# Patient Record
Sex: Female | Born: 1950 | Race: White | Hispanic: No | Smoking: Never smoker
Health system: Southern US, Community
[De-identification: ages and names within clinical notes are randomized; demographics above are authoritative.]

## PROBLEM LIST (undated history)

## (undated) ENCOUNTER — Emergency Department (HOSPITAL_COMMUNITY): Payer: Medicare HMO

## (undated) DIAGNOSIS — R519 Headache, unspecified: Secondary | ICD-10-CM

## (undated) DIAGNOSIS — G4730 Sleep apnea, unspecified: Secondary | ICD-10-CM

## (undated) DIAGNOSIS — K219 Gastro-esophageal reflux disease without esophagitis: Secondary | ICD-10-CM

## (undated) DIAGNOSIS — E039 Hypothyroidism, unspecified: Secondary | ICD-10-CM

## (undated) DIAGNOSIS — M1990 Unspecified osteoarthritis, unspecified site: Secondary | ICD-10-CM

## (undated) DIAGNOSIS — I10 Essential (primary) hypertension: Secondary | ICD-10-CM

## (undated) DIAGNOSIS — F410 Panic disorder [episodic paroxysmal anxiety] without agoraphobia: Secondary | ICD-10-CM

## (undated) HISTORY — PX: BACK SURGERY: SHX140

## (undated) HISTORY — PX: HIP SURGERY: SHX245

## (undated) HISTORY — PX: NECK SURGERY: SHX720

---

## 1993-01-11 DIAGNOSIS — C801 Malignant (primary) neoplasm, unspecified: Secondary | ICD-10-CM

## 1993-01-11 HISTORY — DX: Malignant (primary) neoplasm, unspecified: C80.1

## 2000-02-25 ENCOUNTER — Encounter: Admission: RE | Admit: 2000-02-25 | Discharge: 2000-02-25 | Payer: Self-pay | Admitting: Family Medicine

## 2000-02-25 ENCOUNTER — Encounter: Payer: Self-pay | Admitting: Family Medicine

## 2000-03-07 ENCOUNTER — Encounter: Payer: Self-pay | Admitting: Family Medicine

## 2000-03-07 ENCOUNTER — Encounter: Admission: RE | Admit: 2000-03-07 | Discharge: 2000-03-07 | Payer: Self-pay | Admitting: Family Medicine

## 2000-09-23 ENCOUNTER — Encounter: Admission: RE | Admit: 2000-09-23 | Discharge: 2000-09-23 | Payer: Self-pay | Admitting: Neurological Surgery

## 2000-09-23 ENCOUNTER — Encounter: Payer: Self-pay | Admitting: Neurological Surgery

## 2004-08-17 ENCOUNTER — Inpatient Hospital Stay (HOSPITAL_COMMUNITY): Admission: RE | Admit: 2004-08-17 | Discharge: 2004-08-19 | Payer: Self-pay | Admitting: Neurological Surgery

## 2004-11-04 ENCOUNTER — Ambulatory Visit (HOSPITAL_COMMUNITY): Admission: RE | Admit: 2004-11-04 | Discharge: 2004-11-04 | Payer: Self-pay | Admitting: Neurological Surgery

## 2006-03-29 ENCOUNTER — Ambulatory Visit (HOSPITAL_COMMUNITY): Admission: RE | Admit: 2006-03-29 | Discharge: 2006-03-30 | Payer: Self-pay | Admitting: Neurological Surgery

## 2008-04-11 ENCOUNTER — Encounter: Admission: RE | Admit: 2008-04-11 | Discharge: 2008-04-11 | Payer: Self-pay | Admitting: Neurological Surgery

## 2010-01-31 ENCOUNTER — Encounter: Payer: Self-pay | Admitting: Neurological Surgery
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
## 2010-05-29 NOTE — Op Note (Signed)
NAME:  Candice Hunt, Candice Hunt                 ACCOUNT NO.:  [id number]   MEDICAL RECORD NO.:  [id number]          PATIENT TYPE:  AMB   LOCATION:  SDS                          FACILITY:  [facility]   PHYSICIAN:  Stefani Dama, M.D.  DATE OF BIRTH:  10-Apr-1950   DATE OF PROCEDURE:  03/29/2006  DATE OF DISCHARGE:                               OPERATIVE REPORT   PREOPERATIVE DIAGNOSIS:  Herniated nucleus pulposus L2-L3, L1-L2, with  left L2 radiculopathy, spondylosis L4-L5 with left lumbar radiculopathy.   POSTOPERATIVE DIAGNOSIS:  Herniated nucleus pulposus L2-L3, L1-L2, with  left L2 radiculopathy, spondylosis L4-L5 with left lumbar radiculopathy.   PROCEDURE:  Hemilaminectomy of L2, microdiscectomy to decompress the L2  nerve root on the left secondary to L1-L2 disc herniation, laminectomy  L4-L5 with foraminotomy on the left, microdiscectomy at L4-L5 left,  operating microscope, microdissection technique.   SURGEON:  Stefani Dama, M.D.   FIRST ASSISTANT:  Clydene Fake, M.D.   ANESTHESIA:  General endotracheal.   INDICATIONS:  Ms. Candice Hunt is a 60 year old individual who has had  significant back and left lower extremity pain radiating from the  anterior thigh to the posterior thigh into the calf, the leg, and the  foot. She was found to have an extruded fragment of disc behind the body  of L2.  It is uncertain whether this disc herniation occurred from L2-L3  and herniated up or occurred from L1-L2 and herniated downwards.  Nonetheless, there is a fragment compressing the L2 nerve root on the  left side. Also on the left side there was noted be severe spondylitic  changes at L4-L5 with lateral recess stenosis and there was suspected to  be a chronic disc herniation at L4-L5.  She is now undergoing surgical  decompression of both these areas.   DESCRIPTION OF PROCEDURE:  The patient was brought to the operating room  supine on the stretcher. After the smooth induction of general  endotracheal anesthesia, she was turned prone.  Her back was prepped  with alcohol and DuraPrep and draped in a sterile fashion.  A midline  incision was creating and carried down to the lumbodorsal fascia which  was opened to the left side of midline.  The first interspace identified  was that of L3.  At the L3-L4 interspace, dissection was carried  inferiorly to expose L4-L5.  Dissection was also carried superiorly in a  subperiosteal fashion and dissection was performed at L2-L3 to expose  the entire laminar arch of L2.  A self-retaining retractor was placed in  the wound at the L2-L3 level.   The laminotomy was initially created removing the inferior margin of the  lamina of L2 and this exposed the region of the disc space at L2-L3. The  area was palpated and no disc material was noted to be extruded from  this area and the ligament was intact.  Dissection was then carried  cephalad completing the hemilaminectomy of the L2 vertebrae on the left  and removing the redundant yellow ligament in this area and also  removing some of the inferior margin  of the facet of L1. This exposed  the L1-L2 disc space. By palpating lateral and inferior from the disc  space, a fragment of disc was encountered.  This was on the medial  aspect of the L2 nerve root.  This was resected and allowed for  decompression of the L2 nerve root.  Further dissection yielded several  other fragments of disc that were compressing the L2 nerve root from  underneath it and then the inferior aspect the L2 nerve root was  explored and several other fragments of disc were encountered here. The  area out the foramen of L2 was palpated.  No disc fragments were  obtained from this dissection.  This dissection was performed with the  use of the operating microscope using a combination of bipolar cautery  and microdissection technique to resect and remove the disc and  decompress the L2 nerve root.  In the end, the L2 nerve  root was well  decompressed.   Attention was then turned L4-L5.  Here, a laminotomy was created  removing the inferior margin of the  lamina of L4. The dissection was  taken out to the facet joint and a partial facetectomy was completed.  The superior margin of the lamina of L5 was also resected and this  exposed the common dural tube.  Here, the L5 nerve root was noted to be  bowed significantly dorsally in the takeoff zone and with some  dissection, there was noted to be a significant amount of fascial  attachments in a scar like fashion and the nerve root could ultimately  be mobilized.  The underlying mass was, in fact, noted to be a fragment  of disc which was fairly scarred into place. By dissecting under the  fragment, the disc could gradually be mobilized and with removal of this  fragment, the foramen was noted to be much more widely patent. Further  dissection along the path of the nerve root yielded no other fragments.  There was some fascial attachments from adhesions. The nerve root was  mobilized and freed out the foramen and more proximally, the nerve root  was palpated and relieved from its attachment to the posterior  longitudinal ligament. In this area, the posterior longitudinal ligament  was cauterized.  No openings into the ligament were encountered.  It was  felt that this disc herniation likely had occurred some time ago and  there had been a considerable amount of healing over it.  With the nerve  root being decompressed in this region, hemostasis was achieved  meticulously in the epidural space.  It should be noted that both these  sites, L2 lateral pedicle and the L4-L5 interspace were identified  positively with radiography to assure that the locations corresponded  with the findings on the MRI films.   These nerve roots being decompressed, hemostasis was achieved. 1 mL of fentanyl, 50 mcg, with 40 mg of Depo-Medrol was left in the epidural  space.  The  microscope was then withdrawn.  The lumbodorsal fascia was  closed with #1 Vicryl in an interrupted fashion, 2-0 Vicryl was used in  the subcutaneous tissues, 3-0 Vicryl was used to close the subcuticular  skin.  The patient tolerated the procedure well.  Blood loss was  estimated at 100 mL.  She was returned to the recovery room in stable  condition.      Stefani Dama, M.D.  Electronically Signed     HJE/MEDQ  D:  03/29/2006  T:  03/30/2006  Job:  [id number]
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
## 2012-03-23 ENCOUNTER — Encounter (HOSPITAL_BASED_OUTPATIENT_CLINIC_OR_DEPARTMENT_OTHER): Payer: Self-pay | Admitting: *Deleted

## 2012-03-23 ENCOUNTER — Emergency Department (HOSPITAL_BASED_OUTPATIENT_CLINIC_OR_DEPARTMENT_OTHER)
Admission: EM | Admit: 2012-03-23 | Discharge: 2012-03-23 | Disposition: A | Discharge status: Home / Self Care | Payer: Medicare Other | Attending: Emergency Medicine | Admitting: Emergency Medicine

## 2012-03-23 ENCOUNTER — Emergency Department (HOSPITAL_BASED_OUTPATIENT_CLINIC_OR_DEPARTMENT_OTHER): Payer: Medicare Other

## 2012-03-23 DIAGNOSIS — Z79899 Other long term (current) drug therapy: Secondary | ICD-10-CM | POA: Insufficient documentation

## 2012-03-23 DIAGNOSIS — S6990XA Unspecified injury of unspecified wrist, hand and finger(s), initial encounter: Secondary | ICD-10-CM | POA: Insufficient documentation

## 2012-03-23 DIAGNOSIS — Y9289 Other specified places as the place of occurrence of the external cause: Secondary | ICD-10-CM | POA: Insufficient documentation

## 2012-03-23 DIAGNOSIS — Z7982 Long term (current) use of aspirin: Secondary | ICD-10-CM | POA: Insufficient documentation

## 2012-03-23 DIAGNOSIS — F410 Panic disorder [episodic paroxysmal anxiety] without agoraphobia: Secondary | ICD-10-CM | POA: Insufficient documentation

## 2012-03-23 DIAGNOSIS — Z23 Encounter for immunization: Secondary | ICD-10-CM | POA: Insufficient documentation

## 2012-03-23 DIAGNOSIS — Y939 Activity, unspecified: Secondary | ICD-10-CM | POA: Insufficient documentation

## 2012-03-23 DIAGNOSIS — I10 Essential (primary) hypertension: Secondary | ICD-10-CM | POA: Insufficient documentation

## 2012-03-23 DIAGNOSIS — IMO0001 Reserved for inherently not codable concepts without codable children: Secondary | ICD-10-CM | POA: Insufficient documentation

## 2012-03-23 HISTORY — DX: Panic disorder (episodic paroxysmal anxiety): F41.0

## 2012-03-23 HISTORY — DX: Essential (primary) hypertension: I10

## 2012-03-23 MED ORDER — TETANUS-DIPHTH-ACELL PERTUSSIS 5-2.5-18.5 LF-MCG/0.5 IM SUSP
0.5000 mL | Freq: Once | INTRAMUSCULAR | Status: AC
  Administered 2012-03-23: 0.5 mL via INTRAMUSCULAR
  Filled 2012-03-23: qty 0.5

## 2012-03-23 MED ORDER — SODIUM CHLORIDE 0.9 % IV SOLN
3.0000 g | Freq: Once | INTRAVENOUS | Status: AC
  Administered 2012-03-23: 3 g via INTRAVENOUS
  Filled 2012-03-23: qty 3

## 2012-03-23 MED ORDER — AMOXICILLIN-POT CLAVULANATE 875-125 MG PO TABS: 1.0000 | ORAL_TABLET | Freq: Two times a day (BID) | ORAL | Status: AC

## 2012-03-23 MED ORDER — SODIUM CHLORIDE 0.9 % IV SOLN: Freq: Once | INTRAVENOUS | Status: DC

## 2012-03-23 NOTE — ED Notes (Signed)
Cat bite to right hand with swelling and redness noted. Incident occurred on Sunday.

## 2012-03-23 NOTE — ED Provider Notes (Signed)
History     CSN: [id number]  Arrival date & time 03/23/12  0320   First MD Initiated Contact with Patient 03/23/12 [phone]      Chief Complaint  Patient presents with  . Animal Bite    (Consider location/radiation/quality/duration/timing/severity/associated sxs/prior treatment) HPI This 62 year old female who was bit in the right hand by a stray cat 4 days ago. The cat's immunization status is unknown. The location of the bite is over the proximal dorsal aspect of the first metacarpal. It has subsequently become erythematous, swollen and more painful. The symptoms are mild to moderate. They're worse with movement or palpation. She has had some chills with this. She states she is very anxious.  Past Medical History  Diagnosis Date  . Hypertension   . Panic attacks     Past Surgical History  Procedure Laterality Date  . Back surgery    . Neck surgery    . Hip surgery      History reviewed. No pertinent family history.  History  Substance Use Topics  . Smoking status: Never Smoker   . Smokeless tobacco: Not on file  . Alcohol Use: No    OB History   Grav Para Term Preterm Abortions TAB SAB Ect Mult Living                  Review of Systems  All other systems reviewed and are negative.    Allergies  Avelox and Prednisone  Home Medications   Current Outpatient Rx  Name  Route  Sig  Dispense  Refill  . ALPRAZolam (XANAX PO)   Oral   Take 10 mg by mouth as needed.         Marland Kitchen amLODipine-valsartan (EXFORGE) 5-160 MG per tablet   Oral   Take 1 tablet by mouth daily.         Marland Kitchen aspirin 81 MG tablet   Oral   Take 81 mg by mouth daily.         Marland Kitchen escitalopram (LEXAPRO) 10 MG tablet   Oral   Take 30 mg by mouth daily.         Marland Kitchen oxyCODONE-acetaminophen (PERCOCET) 7.5-325 MG per tablet   Oral   Take 1 tablet by mouth every 4 (four) hours as needed for pain.           BP 159/79  Pulse 89  Temp(Src) 97.8 F (36.6 C) (Oral)  Resp 18  Ht 5\' 1"   (1.549 m)  Wt 142 lb (64.411 kg)  BMI 26.84 kg/m2  SpO2 98%  Physical Exam General: Well-developed, well-nourished female in no acute distress; appearance consistent with age of record HENT: normocephalic, atraumatic Eyes: pupils equal round and reactive to light; extraocular muscles intact Neck: supple Heart: regular rate and rhythm Lungs: clear to auscultation bilaterally Abdomen: soft; nondistended; bowel sounds present Extremities: No deformity; full range of motion; pulses normal Neurologic: Awake, alert and oriented; motor function intact in all extremities and symmetric; no facial droop Skin: Warm and dry; bite wound overlying dorsal right first metacarpal with mild surrounding erythema, tenderness and swelling, no lymphangitis seen; right-hand neurovascularly intact Psychiatric: Anxious    ED Course  Procedures (including critical care time)    MDM  3:49 AM The patient is adamant about not be getting the rabies series at this time. She was advised that rabies is universally fatal. She will make an attempt to locate the cat tomorrow or have animal control will get the cat. If unable that  she will return to start the series.  She is amenable to a tetanus booster as well as antibiotics. We will give 3 g of Unasyn IV and place her on Augmentin as an outpatient.  Dg Hand Complete Right  03/23/2012  *RADIOLOGY REPORT*  Clinical Data: Cat bite to left thumb, with swelling.  RIGHT HAND - COMPLETE 3+ VIEW  Comparison: None.  Findings: Degenerative changes of the interphalangeal joints and first carpal-metacarpal joint. There is mild ulnar subluxation of the first carpal-metacarpal joint.  Degenerative changes of the third carpal-metacarpal joint, with hook osteophyte off the head of the third metacarpal.  Mild radiocarpal degenerative change. No displaced fracture.  No dislocation.  No radiopaque foreign body.  IMPRESSION: No acute osseous finding.  Mild ulnar subluxation at the first  metacarpal phalangeal joint is likely degenerative in nature.  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   Original Report Authenticated By: Jearld Lesch, M.D.        Hanley Seamen, MD 03/23/12 [phone]

## 2012-03-23 NOTE — ED Notes (Signed)
Pt states that she currently trying to get signed up again with pain management for her chronic pain issues.

## 2012-03-23 NOTE — ED Notes (Signed)
Pt states that she is not interested in receiving rabies vaccine just concerned with having an infection in the site.

## 2012-03-23 NOTE — ED Notes (Signed)
Patient transported to X-ray 

## 2012-03-23 NOTE — ED Notes (Signed)
MD at bedside. 

## 2012-03-23 NOTE — ED Notes (Signed)
Reminded pt of importance of contacting animal control and obtaining cat in order to test for rabies. Pt continues to refuse rabies injections at this time and was reminded to follow up if her sxs worsened or if she was unable to locate the animal. Pt agrees with plan of care and verbalizes need for further care related to this issue.

## 2019-03-22 ENCOUNTER — Emergency Department (HOSPITAL_COMMUNITY): Payer: Medicare HMO

## 2019-03-22 ENCOUNTER — Emergency Department (HOSPITAL_COMMUNITY)
Admission: EM | Admit: 2019-03-22 | Discharge: 2019-03-22 | Disposition: A | Discharge status: Home / Self Care | Payer: Medicare HMO | Attending: Emergency Medicine | Admitting: Emergency Medicine

## 2019-03-22 ENCOUNTER — Other Ambulatory Visit: Payer: Self-pay

## 2019-03-22 DIAGNOSIS — M542 Cervicalgia: Secondary | ICD-10-CM | POA: Insufficient documentation

## 2019-03-22 DIAGNOSIS — Z79899 Other long term (current) drug therapy: Secondary | ICD-10-CM | POA: Diagnosis not present

## 2019-03-22 DIAGNOSIS — R202 Paresthesia of skin: Secondary | ICD-10-CM | POA: Diagnosis not present

## 2019-03-22 DIAGNOSIS — R42 Dizziness and giddiness: Secondary | ICD-10-CM | POA: Diagnosis not present

## 2019-03-22 DIAGNOSIS — I672 Cerebral atherosclerosis: Secondary | ICD-10-CM | POA: Insufficient documentation

## 2019-03-22 DIAGNOSIS — G8929 Other chronic pain: Secondary | ICD-10-CM | POA: Insufficient documentation

## 2019-03-22 LAB — CBC WITH DIFFERENTIAL/PLATELET
Abs Immature Granulocytes: 0.02 10*3/uL (ref 0.00–0.07)
Basophils Absolute: 0.1 10*3/uL (ref 0.0–0.1)
Basophils Relative: 1 %
Eosinophils Absolute: 0.2 10*3/uL (ref 0.0–0.5)
Eosinophils Relative: 3 %
HCT: 43.5 % (ref 36.0–46.0)
Hemoglobin: 14.5 g/dL (ref 12.0–15.0)
Immature Granulocytes: 0 %
Lymphocytes Relative: 28 %
Lymphs Abs: 2.1 10*3/uL (ref 0.7–4.0)
MCH: 29.1 pg (ref 26.0–34.0)
MCHC: 33.3 g/dL (ref 30.0–36.0)
MCV: 87.2 fL (ref 80.0–100.0)
Monocytes Absolute: 0.7 10*3/uL (ref 0.1–1.0)
Monocytes Relative: 10 %
Neutro Abs: 4.3 10*3/uL (ref 1.7–7.7)
Neutrophils Relative %: 58 %
Platelets: 222 10*3/uL (ref 150–400)
RBC: 4.99 MIL/uL (ref 3.87–5.11)
RDW: 11.4 % — ABNORMAL LOW (ref 11.5–15.5)
WBC: 7.3 10*3/uL (ref 4.0–10.5)
nRBC: 0 % (ref 0.0–0.2)

## 2019-03-22 LAB — I-STAT CREATININE, ED: Creatinine, Ser: 0.4 mg/dL — ABNORMAL LOW (ref 0.44–1.00)

## 2019-03-22 LAB — BASIC METABOLIC PANEL
Anion gap: 12 (ref 5–15)
BUN: 16 mg/dL (ref 8–23)
CO2: 24 mmol/L (ref 22–32)
Calcium: 9.6 mg/dL (ref 8.9–10.3)
Chloride: 103 mmol/L (ref 98–111)
Creatinine, Ser: 0.49 mg/dL (ref 0.44–1.00)
GFR calc Af Amer: >60 mL/min (ref >60)
GFR calc non Af Amer: >60 mL/min (ref >60)
Glucose, Bld: 97 mg/dL (ref 70–99)
Potassium: 3.9 mmol/L (ref 3.5–5.1)
Sodium: 139 mmol/L (ref 135–145)

## 2019-03-22 MED ORDER — GADOBUTROL 1 MMOL/ML IV SOLN
6.0000 mL | Freq: Once | INTRAVENOUS | Status: AC | PRN
  Administered 2019-03-22: 20:00:00 6 mL via INTRAVENOUS

## 2019-03-22 NOTE — ED Provider Notes (Signed)
[facility] EMERGENCY DEPARTMENT Provider Note   CSN: [id number] Arrival date & time: 03/22/19  1426     History Chief Complaint  Patient presents with  . Neck Pain    Candice Hunt is a 69 y.o. female.  69 yo F with a chief complaints of neck pain.  Patient has a history of chronic neck pain but it is worsened over the past couple weeks after she had a articulation of her spine by a massage therapist.  Has been feeling dizziness off and on as well as has worsening of her chronic left-sided tingling.  She feels that her neck is been popping constantly with very minimal movement.  She tried to set up an appointment with her neurosurgeon but he was out of town.  She had a virtual appointment with her family doctor today who suggested she go immediately to the ED for evaluation.  Has had some chronic numbness and weakness to her left upper and left lower extremity but thinks it is worse.  Has difficulty describing the dizziness.  States it comes and goes and has trouble deciding when it occurs.  She denies overt trauma to her neck.  The history is provided by the patient.  Neck Pain Pain location:  Generalized neck Quality:  Shooting Pain radiates to:  Does not radiate Pain severity:  Moderate Onset quality:  Gradual Duration:  2 weeks Timing:  Constant Progression:  Worsening Chronicity:  New Relieved by:  Nothing Worsened by:  Nothing Ineffective treatments:  None tried Associated symptoms: no chest pain, no fever and no headaches        Past Medical History:  Diagnosis Date  . Hypertension   . Panic attacks     There are no problems to display for this patient.   Past Surgical History:  Procedure Laterality Date  . BACK SURGERY    . HIP SURGERY    . NECK SURGERY       OB History   No obstetric history on file.     No family history on file.  Social History   Tobacco Use  . Smoking status: Never Smoker  Substance Use Topics  . Alcohol  use: No  . Drug use: No    Home Medications Prior to Admission medications   Medication Sig Start Date End Date Taking? Authorizing Provider  ALPRAZolam Duanne Moron) 1 MG tablet Take 1 mg by mouth 3 (three) times daily as needed for anxiety.   Yes [provider]  amLODipine-valsartan (EXFORGE) 10-160 MG tablet Take 0.5 tablets by mouth daily. 11/13/18  Yes [provider]  Cholecalciferol (VITAMIN D3) 50 MCG (2000 UT) TABS Take 2,000 Units by mouth 3 (three) times a week.   Yes [provider]  citalopram (CELEXA) 20 MG tablet Take 30 mg by mouth daily. 01/01/19  Yes [provider]  Cyanocobalamin (VITAMIN B-12 PO) Take 1 tablet by mouth daily.   Yes [provider]  HYDROcodone-acetaminophen (NORCO/VICODIN) 5-325 MG tablet Take 0.5-1 tablets by mouth every 6 (six) hours as needed for moderate pain or severe pain.    Yes [provider]  hydrOXYzine (ATARAX/VISTARIL) 25 MG tablet Take 25 mg by mouth at bedtime. 01/27/19  Yes [provider]  levothyroxine (SYNTHROID) 25 MCG tablet Take 25 mcg by mouth daily before breakfast. 12/18/18  Yes [provider]  meloxicam (MOBIC) 15 MG tablet Take 15 mg by mouth daily. 03/09/19  Yes [provider]  omeprazole (PRILOSEC) 40 MG  capsule Take 40 mg by mouth in the morning and at bedtime.   Yes [provider]  Polyethyl Glycol-Propyl Glycol 0.4-0.3 % SOLN Place 1 drop into both eyes as needed (for dryness).    Yes [provider]  traMADol (ULTRAM) 50 MG tablet Take 50 mg by mouth 2 (two) times daily as needed (for pain).   Yes [provider]  triamcinolone cream (KENALOG) 0.1 % Apply 1 application topically See admin instructions. Apply to affected area(s) 2-3 times a day as needed for itching 03/09/19  Yes [provider]  amoxicillin-clavulanate (AUGMENTIN) 875-125 MG per tablet Take 1 tablet by mouth 2 (two) times daily. One po bid x 7  days Patient not taking: Reported on 03/22/2019 03/23/12   Molpus, John, MD    Allergies    Moxifloxacin, Avelox [moxifloxacin hcl in nacl], and Prednisone  Review of Systems   Review of Systems  Constitutional: Negative for chills and fever.  HENT: Negative for congestion and rhinorrhea.   Eyes: Negative for redness and visual disturbance.  Respiratory: Negative for shortness of breath and wheezing.   Cardiovascular: Negative for chest pain and palpitations.  Gastrointestinal: Negative for nausea and vomiting.  Genitourinary: Negative for dysuria and urgency.  Musculoskeletal: Positive for neck pain. Negative for arthralgias and myalgias.  Skin: Negative for pallor and wound.  Neurological: Negative for dizziness and headaches.    Physical Exam Updated Vital Signs BP (!) 148/69   Pulse 74   Temp 98.5 F (36.9 C) (Oral)   Resp 18   SpO2 96%   Physical Exam Vitals and nursing note reviewed.  Constitutional:      General: She is not in acute distress.    Appearance: She is well-developed. She is not diaphoretic.  HENT:     Head: Normocephalic and atraumatic.     Comments: Prominent thoracic kyphosis.  No obvious midline spinal tenderness.  Likely chronic deformity. Eyes:     Pupils: Pupils are equal, round, and reactive to light.  Cardiovascular:     Rate and Rhythm: Normal rate and regular rhythm.     Heart sounds: No murmur. No friction rub. No gallop.   Pulmonary:     Effort: Pulmonary effort is normal.     Breath sounds: No wheezing or rales.  Abdominal:     General: There is no distension.     Palpations: Abdomen is soft.     Tenderness: There is no abdominal tenderness.  Musculoskeletal:        General: No tenderness.     Cervical back: Normal range of motion and neck supple.     Comments: Pulse motor and sensation intact to bilateral upper and lower extremities.  Mild weakness to left lower extremity secondary to back pain.  Skin:    General: Skin is warm and  dry.  Neurological:     Mental Status: She is alert and oriented to person, place, and time.  Psychiatric:        Behavior: Behavior normal.     ED Results / Procedures / Treatments   Labs (all labs ordered are listed, but only abnormal results are displayed) Labs Reviewed  CBC WITH DIFFERENTIAL/PLATELET - Abnormal; Notable for the following components:      Result Value   RDW 11.4 (*)    All other components within normal limits  I-STAT CREATININE, ED - Abnormal; Notable for the following components:   Creatinine, Ser 0.40 (*)    All other components within normal limits  BASIC METABOLIC PANEL    EKG None  Radiology MR ANGIO HEAD WO CONTRAST  Result Date: 03/22/2019 CLINICAL DATA:  69 year old female with acute neck pain. Prior cervical spine surgery. Possible vertebral artery dissection. EXAM: MRA HEAD WITHOUT CONTRAST MRA NECK WITHOUT AND WITH CONTRAST TECHNIQUE: Angiographic images of the Circle of Willis were obtained using MRA technique without intravenous contrast. Angiographic images of the neck were obtained using MRA technique without and with intravenous contrast. Carotid stenosis measurements (when applicable) are obtained utilizing NASCET criteria, using the distal internal carotid diameter as the denominator. CONTRAST:  85mL GADAVIST GADOBUTROL 1 MMOL/ML IV SOLN COMPARISON:  Brain MRI and Cervical spine MRI today reported separately. FINDINGS: MRA NECK FINDINGS Precontrast time-of-flight images demonstrate antegrade flow in both cervical carotid and vertebral arteries to the skull base. The vertebral arteries appear codominant. The carotid bifurcations are within normal limits. Post-contrast neck MRA images reveal a 3 vessel arch configuration. Proximal great vessels appear normal. Mildly tortuous proximal right CCA. At the right carotid bifurcation mild irregularity primarily affects the proximal ECA. No cervical right ICA stenosis. Mild irregularity of the visible right ICA  siphon. Negative proximal left CCA. Negative left carotid bifurcation and cervical left ICA. Negative visible left ICA siphon. Proximal subclavian arteries and vertebral artery origins appear normal. The bilateral cervical vertebral arteries appear symmetric without irregularity or stenosis. Negative visible posterior circulation. MRA HEAD FINDINGS Antegrade flow in the posterior circulation. Codominant distal vertebral arteries without stenosis. Normal right PICA origin. The left PICA appears to be diminutive or absent. Patent vertebrobasilar junction and basilar artery without stenosis. Patent SCA and PCA origins. Posterior communicating arteries are diminutive or absent. Right PCA branches are within normal limits. There is a moderate stenosis of the left PCA P1/P2 junction. Other left PCA branches are within normal limits. Antegrade flow in both ICA siphons. No siphon stenosis. Normal ophthalmic artery origins. Patent carotid termini. Patent MCA and ACA origins. Anterior communicating artery and visible ACA branches are within normal limits. Left MCA M1 segment, left MCA bifurcation and visible left MCA branches are within normal limits. Right MCA M1 segment, trifurcation, and visible right MCA branches are within normal limits. IMPRESSION: 1. Negative neck MRA. No evidence of vertebral artery dissection across this series of head and neck MR today. 2. Evidence of some intracranial atherosclerosis, with a moderate stenosis of the left PCA. 3. Otherwise negative intracranial MRA. Electronically Signed   By: Genevie Ann M.D.   On: 03/22/2019 19:50   MR Angiogram Neck W or Wo Contrast  Result Date: 03/22/2019 CLINICAL DATA:  69 year old female with acute neck pain. Prior cervical spine surgery. Possible vertebral artery dissection. EXAM: MRA HEAD WITHOUT CONTRAST MRA NECK WITHOUT AND WITH CONTRAST TECHNIQUE: Angiographic images of the Circle of Willis were obtained using MRA technique without intravenous contrast.  Angiographic images of the neck were obtained using MRA technique without and with intravenous contrast. Carotid stenosis measurements (when applicable) are obtained utilizing NASCET criteria, using the distal internal carotid diameter as the denominator. CONTRAST:  55mL GADAVIST GADOBUTROL 1 MMOL/ML IV SOLN COMPARISON:  Brain MRI and Cervical spine MRI today reported separately. FINDINGS: MRA NECK FINDINGS Precontrast time-of-flight images demonstrate antegrade flow in both cervical carotid and vertebral arteries to the skull base. The vertebral arteries appear codominant. The carotid bifurcations are within normal limits. Post-contrast neck MRA images reveal a 3 vessel arch configuration. Proximal great vessels appear normal. Mildly tortuous proximal right CCA. At the right carotid bifurcation mild irregularity primarily affects  the proximal ECA. No cervical right ICA stenosis. Mild irregularity of the visible right ICA siphon. Negative proximal left CCA. Negative left carotid bifurcation and cervical left ICA. Negative visible left ICA siphon. Proximal subclavian arteries and vertebral artery origins appear normal. The bilateral cervical vertebral arteries appear symmetric without irregularity or stenosis. Negative visible posterior circulation. MRA HEAD FINDINGS Antegrade flow in the posterior circulation. Codominant distal vertebral arteries without stenosis. Normal right PICA origin. The left PICA appears to be diminutive or absent. Patent vertebrobasilar junction and basilar artery without stenosis. Patent SCA and PCA origins. Posterior communicating arteries are diminutive or absent. Right PCA branches are within normal limits. There is a moderate stenosis of the left PCA P1/P2 junction. Other left PCA branches are within normal limits. Antegrade flow in both ICA siphons. No siphon stenosis. Normal ophthalmic artery origins. Patent carotid termini. Patent MCA and ACA origins. Anterior communicating artery and  visible ACA branches are within normal limits. Left MCA M1 segment, left MCA bifurcation and visible left MCA branches are within normal limits. Right MCA M1 segment, trifurcation, and visible right MCA branches are within normal limits. IMPRESSION: 1. Negative neck MRA. No evidence of vertebral artery dissection across this series of head and neck MR today. 2. Evidence of some intracranial atherosclerosis, with a moderate stenosis of the left PCA. 3. Otherwise negative intracranial MRA. Electronically Signed   By: Genevie Ann M.D.   On: 03/22/2019 19:50   MR BRAIN WO CONTRAST  Result Date: 03/22/2019 CLINICAL DATA:  69 year old female with acute neck pain. Prior cervical spine surgery. Possible vertebral artery dissection. EXAM: MRI HEAD WITHOUT CONTRAST TECHNIQUE: Multiplanar, multiecho pulse sequences of the brain and surrounding structures were obtained without intravenous contrast. COMPARISON:  Report of [facility] brain MRI 05/01/2013 (no images available). FINDINGS: Brain: No restricted diffusion to suggest acute infarction. No midline shift, mass effect, evidence of mass lesion, ventriculomegaly, extra-axial collection or acute intracranial hemorrhage. Cervicomedullary junction and pituitary are within normal limits. Mild for age scattered small and subtle nonspecific white matter T2 and FLAIR hyperintense foci in both hemispheres. No cortical encephalomalacia. No definite chronic cerebral blood products. The deep gray nuclei, brainstem and cerebellum appear normal. Vascular: Major intracranial vascular flow voids are preserved. The distal vertebral arteries appear codominant. Skull and upper cervical spine: Cervical spine details reported separately. Normal bone marrow signal. Sinuses/Orbits: Negative orbits. Paranasal sinuses are clear. Other: Mastoids are clear. Grossly normal visible internal auditory structures. Scalp and face soft tissues appear negative.  IMPRESSION: 1. No acute intracranial abnormality. Mild for age nonspecific cerebral white matter signal changes. 2. MRA and cervical spine MRI today reported separately. Electronically Signed   By: Genevie Ann M.D.   On: 03/22/2019 19:59   MR Cervical Spine W or Wo Contrast  Result Date: 03/22/2019 CLINICAL DATA:  70 year old female with acute neck pain. Prior cervical spine surgery. Possible vertebral artery dissection. EXAM: MRI CERVICAL SPINE WITHOUT AND WITH CONTRAST TECHNIQUE: Multiplanar and multiecho pulse sequences of the cervical spine, to include the craniocervical junction and cervicothoracic junction, were obtained without and with intravenous contrast. CONTRAST:  14mL GADAVIST GADOBUTROL 1 MMOL/ML IV SOLN COMPARISON:  Brain MRI, head and neck MRA today reported separately. FINDINGS: Alignment: Straightening of cervical lordosis with subtle anterolisthesis of C3 on C4. Vertebrae: Previous long segment C4 through T1 ACDF with associated hardware susceptibility artifact. There is degenerative appearing marrow edema and enhancement in the right posterosuperior T2 endplate and pedicle (series 9,  image 5 and series 13, image 7. There is also mild marrow edema in the C3 posterior elements on series 9, image 10. No other marrow edema or acute osseous abnormality. Normal background bone marrow signal. Cord: Spinal cord signal is within normal limits at all visualized levels. No abnormal intradural enhancement. No dural thickening. Posterior Fossa, vertebral arteries, paraspinal tissues: The vertebral artery flow voids in the neck are preserved, appear symmetric and normal. The other major vascular flow voids in the neck are preserved. Negative visible neck soft tissues and lung apices. Disc levels: C2-C3: Mild facet and ligament flavum hypertrophy. Effaced dorsal CSF space but no significant stenosis. C3-C4: Subtle anterolisthesis with circumferential disc bulge and endplate spurring and moderate to severe facet  and ligament flavum hypertrophy. Trace degenerative facet joint fluid. Mild posterior element marrow edema as stated above. Mild spinal stenosis and spinal cord mass effect (series 7, image 9). Severe bilateral C4 foraminal stenosis. C4-C5: Prior ACDF with evidence of solid arthrodesis and no stenosis. Left facet hypertrophy. C5-C6: Prior ACDF with evidence of solid arthrodesis and no stenosis. C6-C7: Prior ACDF with evidence of solid arthrodesis and no stenosis. C7-T1: Prior ACDF with evidence of solid arthrodesis and no stenosis. T1-T2: Disc space loss with circumferential disc bulge and endplate spurring. Moderate posterior element hypertrophy. Effaced ventral CSF space but no significant spinal stenosis. Moderate to severe left and severe right T1 foraminal stenosis. T2-T3: Disc space loss and posterior element hypertrophy with mild left and mild-to-moderate right T2 foraminal stenosis. IMPRESSION: 1. Previous long segment spinal fusion C4 through T1. Evidence of solid arthrodesis and no adverse features. 2. 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 Mild degenerative posterior element marrow edema. Mild spinal stenosis with spinal cord mass effect but no cord signal abnormality. Severe bilateral C4 foraminal stenosis. -T1-T2 advanced disc and moderate posterior element degeneration. Mild degenerative appearing right T2 pedicle marrow edema. Moderate to severe bilateral T1 foraminal stenosis without significant spinal stenosis. 3. Symmetric and normal appearing vertebral artery flow voids in the neck. Electronically Signed   By: Genevie Ann M.D.   On: 03/22/2019 19:58    Procedures Procedures (including critical care time)  Medications Ordered in ED Medications  gadobutrol (GADAVIST) 1 MMOL/ML injection 6 mL (6 mLs Intravenous Contrast Given 03/22/19 1932)    ED Course  I have reviewed the triage vital  signs and the nursing notes.  Pertinent labs & imaging results that were available during my care of the patient were reviewed by me and considered in my medical decision making (see chart for details).    MDM Rules/Calculators/A&P                      69 yo F with a chief complaints of neck pain.  Is a chronic problem for her but worsening over the past couple weeks after she had a articulation during a massage.  History could be concerning for a carotid or vertebral artery dissection.  She is describing some vertiginous symptoms though she has a lot of difficulty describing it.  On exam the patient appears intoxicated.  Plans on obtaining initially CT scans that the patient is declining and is pretty insistent on obtaining an MRI.  MRI negative for acute impingement.  No fx, no ligamentous instability, no vertebral or carotid artery injury.   11:02 PM:  I have discussed the diagnosis/risks/treatment options with the patient and believe  the pt to be eligible for discharge home to follow-up with Neurosurgery. We also discussed returning to the ED immediately if new or worsening sx occur. We discussed the sx which are most concerning (e.g., sudden worsening pain, fever, inability to tolerate by mouth) that necessitate immediate return. Medications administered to the patient during their visit and any new prescriptions provided to the patient are listed below.  Medications given during this visit Medications  gadobutrol (GADAVIST) 1 MMOL/ML injection 6 mL (6 mLs Intravenous Contrast Given 03/22/19 1932)     The patient appears reasonably screen and/or stabilized for discharge and I doubt any other medical condition or other [facility] requiring further screening, evaluation, or treatment in the ED at this time prior to discharge.   Final Clinical Impression(s) / ED Diagnoses Final diagnoses:  Neck pain    Rx / DC Orders ED Discharge Orders    None       Deno Etienne, DO 03/22/19 2302

## 2019-03-22 NOTE — ED Triage Notes (Signed)
Pt here from home for neck pain x 1 month. Has has hx of multiple neck surgeries and then one month ago she got a massage and they popped her neck and since then it has been worse. C collar applied by EMS because pt sts she could hear something popping in her neck. Pt ambulatory. Dr. Ellene Route neurosurgeon, last surgery in 2008.

## 2019-04-09 ENCOUNTER — Other Ambulatory Visit: Payer: Self-pay | Admitting: Neurological Surgery

## 2019-04-17 ENCOUNTER — Other Ambulatory Visit: Payer: Self-pay

## 2019-04-17 ENCOUNTER — Encounter (HOSPITAL_COMMUNITY)
Admission: RE | Admit: 2019-04-17 | Discharge: 2019-04-17 | Disposition: A | Discharge status: Home / Self Care | Payer: Medicare HMO | Source: Ambulatory Visit | Attending: Neurological Surgery | Admitting: Neurological Surgery

## 2019-04-17 ENCOUNTER — Encounter (HOSPITAL_COMMUNITY): Payer: Self-pay

## 2019-04-17 ENCOUNTER — Other Ambulatory Visit (HOSPITAL_COMMUNITY)
Admission: RE | Admit: 2019-04-17 | Discharge: 2019-04-17 | Disposition: A | Discharge status: Home / Self Care | Payer: Medicare HMO | Source: Ambulatory Visit | Attending: Neurological Surgery | Admitting: Neurological Surgery

## 2019-04-17 DIAGNOSIS — Z01812 Encounter for preprocedural laboratory examination: Secondary | ICD-10-CM | POA: Diagnosis not present

## 2019-04-17 DIAGNOSIS — Z01810 Encounter for preprocedural cardiovascular examination: Secondary | ICD-10-CM | POA: Insufficient documentation

## 2019-04-17 DIAGNOSIS — Z20822 Contact with and (suspected) exposure to covid-19: Secondary | ICD-10-CM | POA: Insufficient documentation

## 2019-04-17 DIAGNOSIS — R9431 Abnormal electrocardiogram [ECG] [EKG]: Secondary | ICD-10-CM | POA: Insufficient documentation

## 2019-04-17 HISTORY — DX: Headache, unspecified: R51.9

## 2019-04-17 HISTORY — DX: Sleep apnea, unspecified: G47.30

## 2019-04-17 HISTORY — DX: Unspecified osteoarthritis, unspecified site: M19.90

## 2019-04-17 HISTORY — DX: Hypothyroidism, unspecified: E03.9

## 2019-04-17 HISTORY — DX: Gastro-esophageal reflux disease without esophagitis: K21.9

## 2019-04-17 LAB — BASIC METABOLIC PANEL
Anion gap: 11 (ref 5–15)
BUN: 17 mg/dL (ref 8–23)
CO2: 22 mmol/L (ref 22–32)
Calcium: 9.6 mg/dL (ref 8.9–10.3)
Chloride: 105 mmol/L (ref 98–111)
Creatinine, Ser: 0.48 mg/dL (ref 0.44–1.00)
GFR calc Af Amer: >60 mL/min (ref >60)
GFR calc non Af Amer: >60 mL/min (ref >60)
Glucose, Bld: 102 mg/dL — ABNORMAL HIGH (ref 70–99)
Potassium: 4.1 mmol/L (ref 3.5–5.1)
Sodium: 138 mmol/L (ref 135–145)

## 2019-04-17 LAB — CBC
HCT: 46.1 % — ABNORMAL HIGH (ref 36.0–46.0)
Hemoglobin: 14.3 g/dL (ref 12.0–15.0)
MCH: 28.4 pg (ref 26.0–34.0)
MCHC: 31 g/dL (ref 30.0–36.0)
MCV: 91.5 fL (ref 80.0–100.0)
Platelets: 230 10*3/uL (ref 150–400)
RBC: 5.04 MIL/uL (ref 3.87–5.11)
RDW: 11.9 % (ref 11.5–15.5)
WBC: 7.5 10*3/uL (ref 4.0–10.5)
nRBC: 0 % (ref 0.0–0.2)

## 2019-04-17 LAB — TYPE AND SCREEN
ABO/RH(D): A POS
Antibody Screen: NEGATIVE

## 2019-04-17 LAB — SARS CORONAVIRUS 2 (TAT 6-24 HRS): SARS Coronavirus 2: NEGATIVE

## 2019-04-17 LAB — SURGICAL PCR SCREEN
MRSA, PCR: NEGATIVE
Staphylococcus aureus: NEGATIVE

## 2019-04-17 LAB — ABO/RH: ABO/RH(D): A POS

## 2019-04-17 NOTE — Progress Notes (Addendum)
Candice Hunt is late for her appointment, I called her, she said she just left her appointment, with further investigation, patient went to have Covid test. I instructed patient and driver where patient needs to come. When Candice Hunt arrived to appointment, she was upset,because of the pain, she cried because of the pain.  Patient reports that she does like to take the pain medication or xanax, it makes me sleepy and I want it to be able to work after surgery.  Candice Hunt states that she has mid sternum chest pain, "a quick sharp pain", my Dr. is aware, she said it is from anxiety."  Candice Hunt said that she does not have anyone to help her at home and [place] So I want to stay in the hospital until I can take care of myself and I am not going anywhere besides home." Candice Hunt pointed to the recliner in the room and said, "I'm going to need one of those, I cant lay down because of my neck," I told patient that she will have to speak with Dr. Ellene Route about it."

## 2019-04-17 NOTE — Progress Notes (Signed)
PCP - [facility]  Cardiologist - no  Chest x-ray - no   EKG - 04/17/2019  Stress Test - no  ECHO - no  Cardiac Cath - no  Sleep Study -" yes, years ago, I could not wear the mask for the CPAP, I know that I still have  Sleep apnea.  CPAP - no  LABS-CBC, BMP, T?S, PCR  ASA-no  ERAS-no  HA1C-na Fasting Blood Sugar - na Checks Blood Sugar _0____ times a day  Anesthesia- Patient complains of  Chest pain , mid chest , it is sharp and quick. Patie said she has had it for a long time, my Dr. said that it is stress related. EKG- Septal infarct- age unknown. Pt denies having chest pain at this time, sob, or fever at this time. All instructions explained to the pt, with a verbal understanding of the material. Pt agrees to go over the instructions while at home for a better understanding. Pt also instructed to self quarantine after being tested for COVID-19. The opportunity to ask questions was provided.   I'm asking PA- C for anesthesia to review.

## 2019-04-17 NOTE — Pre-Procedure Instructions (Signed)
Candice Hunt  04/17/2019     Your procedure is scheduled on Thursday, April 8..  Report to [facility], Main Entrance or Entrance "A" at 9:00                 Your surgery or procedure is scheduled for 11:00  A.M.   Call this number if you have problems the morning of surgery: [phone]  This is the number for the Pre- Surgical Desk.         For any other questions, please call [phone], Monday - Friday 8 AM - 4 PM.      Remember:  Do not eat or drink after midnight Wednesday , April 8.    Take these medicines the morning of surgery with A SIP OF WATER:  citalopram (CELEXA)             levothyroxine (SYNTHROID)             omeprazole (PRILOSEC)                   If needed you may take:               traMADol (ULTRAM)               ALPRAZolam Duanne Moron)  STOP taking Aspirin, Aspirin Products (Goody Powder, Excedrin Migraine), Ibuprofen (Advil), Naproxen (Aleve), meloxicam ,Vitamins and Herbal Products (ie Fish Oil).  Special instructions:  [facility]- Preparing For Surgery  Before surgery, you can play an important role. Because skin is not sterile, your skin needs to be as free of germs as possible. You can reduce the number of germs on your skin by washing with CHG (chlorahexidine gluconate) Soap before surgery.  CHG is an antiseptic cleaner which kills germs and bonds with the skin to continue killing germs even after washing.    Oral Hygiene is also important to reduce your risk of infection.  Remember - BRUSH YOUR TEETH THE MORNING OF SURGERY WITH YOUR REGULAR TOOTHPASTE  Please do not use if you have an allergy to CHG or antibacterial soaps. If your skin becomes reddened/irritated stop using the CHG.  Do not shave (including legs and underarms) for at least 48 hours prior to first CHG shower. It is OK to shave your face.  Please follow these instructions carefully.   1. Shower the NIGHT BEFORE SURGERY and the MORNING OF SURGERY with CHG.   2. If you chose  to wash your hair, wash your hair first as usual with your normal shampoo.  3. After you shampoo, wash your face and private area with the soap you use at home, then rinse your hair and body thoroughly to remove the shampoo and soap.   4. Use CHG as you would any other liquid soap. You can apply CHG directly to the skin and wash gently with a scrungie or a clean washcloth.   5. Apply the CHG Soap to your body ONLY FROM THE NECK DOWN.  Do not use on open wounds or open sores. Avoid contact with your eyes, ears, mouth and genitals (private parts).  6. Wash thoroughly, paying special attention to the area where your surgery will be performed.  7. Thoroughly rinse your body with warm water from the neck down.  8. DO NOT shower/wash with your normal soap after using and rinsing off the CHG Soap.  9. Pat yourself dry with a CLEAN TOWEL.  10. Wear CLEAN PAJAMAS to bed the  night before surgery, wear comfortable clothes the morning of surgery  11. Place CLEAN SHEETS on your bed the night of your first shower and DO NOT SLEEP WITH PETS.  Day of Surgery: Shower as instructed above. Do not wear lotions, powders, or perfumes, or deodorant. Please wear clean clothes to the hospital/surgery center.   Remember to brush your teeth WITH YOUR REGULAR TOOTHPASTE.  Do not shave 48 hours prior to surgery.   Do not bring valuables to the hospital.  [facility] is not responsible for any belongings or valuables.  Contacts, dentures or bridgework may not be worn into surgery.  Leave your suitcase in the car.  After surgery it may be brought to your room.  For patients admitted to the hospital, discharge time will be determined by your treatment team.  Patients discharged the day of surgery will not be allowed to drive home.   Please read over the following fact sheets that you were given: Coughing and Deep Breathing, Pain Booklet, Surgical Site Infections

## 2019-04-18 ENCOUNTER — Encounter (HOSPITAL_COMMUNITY): Payer: Self-pay

## 2019-04-18 NOTE — Anesthesia Preprocedure Evaluation (Addendum)
Anesthesia Evaluation  Patient identified by MRN, date of birth, ID band Patient awake    Reviewed: Allergy & Precautions, NPO status , Patient's Chart, lab work & pertinent test results  Airway Mallampati: III  TM Distance: >3 FB Neck ROM: Limited    Dental  (+) Teeth Intact, Dental Advisory Given   Pulmonary    breath sounds clear to auscultation       Cardiovascular hypertension,  Rhythm:Regular Rate:Normal     Neuro/Psych    GI/Hepatic   Endo/Other    Renal/GU      Musculoskeletal   Abdominal   Peds  Hematology   Anesthesia Other Findings   Reproductive/Obstetrics                            Anesthesia Physical Anesthesia Plan  ASA: II  Anesthesia Plan: General   Post-op Pain Management:    Induction: Intravenous  PONV Risk Score and Plan: Ondansetron and Dexamethasone  Airway Management Planned: Oral ETT  Additional Equipment:   Intra-op Plan:   Post-operative Plan:   Informed Consent: I have reviewed the patients History and Physical, chart, labs and discussed the procedure including the risks, benefits and alternatives for the proposed anesthesia with the patient or authorized representative who has indicated his/her understanding and acceptance.     Dental advisory given  Plan Discussed with: CRNA and Anesthesiologist  Anesthesia Plan Comments: (See PAT note written 04/18/2019 by Myra Gianotti, PA-C. Has anxiety. Daily alcohol.    )      Anesthesia Quick Evaluation

## 2019-04-18 NOTE — Progress Notes (Signed)
Anesthesia Chart Review:  Case: [id number] Date/Time: 04/19/19 1045   Procedure: Cervical 3-4 Anterior cervical decompression/discectomy/fusion (N/A ) - 3C   Anesthesia type: General   Pre-op diagnosis: Cervical stenosis of spine   Location: [facility] OR ROOM 21 / [place] [facility]   Surgeons: Kristeen Miss, MD      DISCUSSION: Patient is a 69 year old female scheduled for the above procedure. She was sent to the ED by PCP on 03/22/19 for worsening neck pain with report of hearing "bones clicking when she moves her neck and is afraid if she moves she could be paralyzed." Imaging showed significant disease at C3-4 and T1-T2 and severe C4 foraminal stenosis (see below). Out-patient neurosurgery follow-up advised. She was crying at her PAT visit due to pain.   History includes never smoker, HTN, anxiety with panic attacks, GERD, OSA (intolerant to CPAP), c-spine surgery (C4-T1 ACDF 08/17/04), lumbar surgery (L2 hemi-laminectomy, L4-5 microdiscectomy 03/29/06), skin cancer (melanoma stage I, s/p excision 1995).   She does drink a daily cocktail or wine.   EKG showed NSR, septal infarct which appears stable since 2008. Labs acceptable for OR. By PAT RN notes, patient reported long time history of "sharp and quick" chest pain that are felt to be related to anxiety. APP attempted to call patient to confirm, but only able to leave a voice message. She does have routine follow-up with primary care with annual exam 12/12/18 and last on 03/22/19 for anxiety and cervical radiculopathy and sent to the ED as noted.  Preoperative COVID-19 test negative on 04/18/19. EKG is stable. Based on PAT RN description and patient's known anxiety, her chest symptoms do sound atypical; however, if she does not call me back before her surgery date then she will require further evaluation/clinicial correlation by her anesthesiologist on the day of surgery per my discussion with anesthesiologist Suella Broad, MD. Plans to proceed as scheduled would depend on  exam findings. I have updated Jessica at Dr. Clarice Pole office and also notified her that patient reported daily alcohol intake.    VS: BP (!) 145/69   Pulse 65   Temp (!) 36.4 C (Oral)   Resp 19   Ht 5\' 1"  (1.549 m)   Wt 62.7 kg   SpO2 100%   BMI 26.11 kg/m   PROVIDERS: Loraine Leriche., MD is PCP ([place])   LABS: Labs reviewed: Acceptable for surgery. (all labs ordered are listed, but only abnormal results are displayed)  Labs Reviewed  CBC - Abnormal; Notable for the following components:      Result Value   HCT 46.1 (*)    All other components within normal limits  BASIC METABOLIC PANEL - Abnormal; Notable for the following components:   Glucose, Bld 102 (*)    All other components within normal limits  SURGICAL PCR SCREEN  TYPE AND SCREEN  ABO/RH     IMAGES: MRI c-spine 03/22/19: IMPRESSION: 1. Previous long segment spinal fusion C4 through T1. Evidence of solid arthrodesis and no adverse features. 2. 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 Mild degenerative posterior element marrow edema. Mild spinal stenosis with spinal cord mass effect but no cord signal abnormality. Severe bilateral C4 foraminal stenosis. -T1-T2 advanced disc and moderate posterior element degeneration. Mild degenerative appearing right T2 pedicle marrow edema. Moderate to severe bilateral T1 foraminal stenosis without significant spinal stenosis. 3. Symmetric and normal appearing vertebral artery flow voids in  the neck.   MRA brain/neck 03/22/19: IMPRESSION: 1. Negative neck MRA. No evidence of vertebral artery dissection across this series of head and neck MR today. 2. Evidence of some intracranial atherosclerosis, with a moderate stenosis of the left PCA. 3. Otherwise negative intracranial MRA.   MRI head 03/22/19: IMPRESSION: 1. No acute intracranial  abnormality. Mild for age nonspecific cerebral white matter signal changes.   EKG: 04/17/19:  Normal sinus rhythm Septal infarct , age undetermined Abnormal ECG - I think EKG is stable when compared to 03/25/06 tracing in [facility].  CV: N/A  Past Medical History:  Diagnosis Date  . Arthritis   . Cancer ([place]) 1995   melanoma  . GERD (gastroesophageal reflux disease)   . Headache    neck headache  . Hypertension   . Hypothyroidism   . Panic attacks   . Panic attacks     Past Surgical History:  Procedure Laterality Date  . BACK SURGERY    . HIP SURGERY    . NECK SURGERY      MEDICATIONS: . ALPRAZolam (XANAX) 1 MG tablet  . amLODipine-valsartan (EXFORGE) 10-160 MG tablet  . amoxicillin-clavulanate (AUGMENTIN) 875-125 MG per tablet  . Cholecalciferol (VITAMIN D3) 50 MCG (2000 UT) TABS  . citalopram (CELEXA) 20 MG tablet  . Cyanocobalamin (VITAMIN B-12 PO)  . HYDROcodone-acetaminophen (NORCO/VICODIN) 5-325 MG tablet  . hydrOXYzine (ATARAX/VISTARIL) 25 MG tablet  . levothyroxine (SYNTHROID) 25 MCG tablet  . meloxicam (MOBIC) 15 MG tablet  . omeprazole (PRILOSEC) 40 MG capsule  . Polyethyl Glycol-Propyl Glycol 0.4-0.3 % SOLN  . traMADol (ULTRAM) 50 MG tablet  . triamcinolone cream (KENALOG) 0.1 %   No current facility-administered medications for this encounter.  Not currently taking Augmentin.    Myra Gianotti, PA-C Surgical Short Stay/Anesthesiology [facility] Phone [phone] [facility] Phone [phone] 04/18/2019 1:13 PM

## 2019-04-19 ENCOUNTER — Encounter (HOSPITAL_COMMUNITY)
Admission: RE | Disposition: A | Discharge status: Home / Self Care | Payer: Self-pay | Source: Home / Self Care | Attending: Neurological Surgery

## 2019-04-19 ENCOUNTER — Ambulatory Visit (HOSPITAL_COMMUNITY): Payer: Medicare HMO | Admitting: Vascular Surgery

## 2019-04-19 ENCOUNTER — Observation Stay (HOSPITAL_COMMUNITY)
Admission: RE | Admit: 2019-04-19 | Discharge: 2019-04-20 | Disposition: A | Discharge status: Home / Self Care | Payer: Medicare HMO | Attending: Neurological Surgery | Admitting: Neurological Surgery

## 2019-04-19 ENCOUNTER — Encounter (HOSPITAL_COMMUNITY): Payer: Self-pay | Admitting: Neurological Surgery

## 2019-04-19 ENCOUNTER — Ambulatory Visit (HOSPITAL_COMMUNITY): Payer: Medicare HMO | Admitting: Certified Registered Nurse Anesthetist

## 2019-04-19 ENCOUNTER — Other Ambulatory Visit: Payer: Self-pay

## 2019-04-19 ENCOUNTER — Ambulatory Visit (HOSPITAL_COMMUNITY): Payer: Medicare HMO

## 2019-04-19 DIAGNOSIS — Z85820 Personal history of malignant melanoma of skin: Secondary | ICD-10-CM | POA: Insufficient documentation

## 2019-04-19 DIAGNOSIS — I10 Essential (primary) hypertension: Secondary | ICD-10-CM | POA: Diagnosis not present

## 2019-04-19 DIAGNOSIS — Z791 Long term (current) use of non-steroidal anti-inflammatories (NSAID): Secondary | ICD-10-CM | POA: Insufficient documentation

## 2019-04-19 DIAGNOSIS — Z981 Arthrodesis status: Secondary | ICD-10-CM | POA: Diagnosis not present

## 2019-04-19 DIAGNOSIS — Z888 Allergy status to other drugs, medicaments and biological substances status: Secondary | ICD-10-CM | POA: Diagnosis not present

## 2019-04-19 DIAGNOSIS — Z79890 Hormone replacement therapy (postmenopausal): Secondary | ICD-10-CM | POA: Insufficient documentation

## 2019-04-19 DIAGNOSIS — M5412 Radiculopathy, cervical region: Secondary | ICD-10-CM | POA: Diagnosis present

## 2019-04-19 DIAGNOSIS — M4712 Other spondylosis with myelopathy, cervical region: Secondary | ICD-10-CM | POA: Diagnosis not present

## 2019-04-19 DIAGNOSIS — Z881 Allergy status to other antibiotic agents status: Secondary | ICD-10-CM | POA: Diagnosis not present

## 2019-04-19 DIAGNOSIS — M4722 Other spondylosis with radiculopathy, cervical region: Secondary | ICD-10-CM | POA: Insufficient documentation

## 2019-04-19 DIAGNOSIS — Z419 Encounter for procedure for purposes other than remedying health state, unspecified: Secondary | ICD-10-CM

## 2019-04-19 DIAGNOSIS — M2578 Osteophyte, vertebrae: Secondary | ICD-10-CM | POA: Diagnosis not present

## 2019-04-19 DIAGNOSIS — Z79899 Other long term (current) drug therapy: Secondary | ICD-10-CM | POA: Insufficient documentation

## 2019-04-19 HISTORY — PX: ANTERIOR CERVICAL DECOMP/DISCECTOMY FUSION: SHX1161

## 2019-04-19 SURGERY — ANTERIOR CERVICAL DECOMPRESSION/DISCECTOMY FUSION 1 LEVEL
Anesthesia: General

## 2019-04-19 MED ORDER — CHLORHEXIDINE GLUCONATE CLOTH 2 % EX PADS: 6.0000 | MEDICATED_PAD | Freq: Once | CUTANEOUS | Status: DC

## 2019-04-19 MED ORDER — HYDROXYZINE HCL 25 MG PO TABS
25.0000 mg | ORAL_TABLET | Freq: Every day | ORAL | Status: DC
  Administered 2019-04-19: 25 mg via ORAL
  Filled 2019-04-19: qty 1

## 2019-04-19 MED ORDER — TRAMADOL HCL 50 MG PO TABS: 50.0000 mg | ORAL_TABLET | Freq: Two times a day (BID) | ORAL | Status: DC | PRN

## 2019-04-19 MED ORDER — ONDANSETRON HCL 4 MG/2ML IJ SOLN
INTRAMUSCULAR | Status: AC
  Filled 2019-04-19: qty 2

## 2019-04-19 MED ORDER — ACETAMINOPHEN 325 MG PO TABS: 650.0000 mg | ORAL_TABLET | ORAL | Status: DC | PRN

## 2019-04-19 MED ORDER — FENTANYL CITRATE (PF) 100 MCG/2ML IJ SOLN
25.0000 ug | INTRAMUSCULAR | Status: DC | PRN
  Administered 2019-04-19: 13:00:00 25 ug via INTRAVENOUS
  Administered 2019-04-19 (×2): 50 ug via INTRAVENOUS

## 2019-04-19 MED ORDER — SODIUM CHLORIDE 0.9 % IV SOLN
INTRAVENOUS | Status: DC | PRN
  Administered 2019-04-19: 500 mL

## 2019-04-19 MED ORDER — PHENOL 1.4 % MT LIQD: 1.0000 | OROMUCOSAL | Status: DC | PRN

## 2019-04-19 MED ORDER — LEVOTHYROXINE SODIUM 25 MCG PO TABS
25.0000 ug | ORAL_TABLET | Freq: Every day | ORAL | Status: DC
  Administered 2019-04-20: 25 ug via ORAL
  Filled 2019-04-19: qty 1

## 2019-04-19 MED ORDER — ROCURONIUM BROMIDE 10 MG/ML (PF) SYRINGE
PREFILLED_SYRINGE | INTRAVENOUS | Status: AC
  Filled 2019-04-19: qty 10

## 2019-04-19 MED ORDER — DOCUSATE SODIUM 100 MG PO CAPS
100.0000 mg | ORAL_CAPSULE | Freq: Two times a day (BID) | ORAL | Status: DC
  Administered 2019-04-19 – 2019-04-20 (×3): 100 mg via ORAL
  Filled 2019-04-19 (×3): qty 1

## 2019-04-19 MED ORDER — ACETAMINOPHEN 650 MG RE SUPP: 650.0000 mg | RECTAL | Status: DC | PRN

## 2019-04-19 MED ORDER — POLYVINYL ALCOHOL 1.4 % OP SOLN
1.0000 [drp] | OPHTHALMIC | Status: DC | PRN
  Filled 2019-04-19: qty 15

## 2019-04-19 MED ORDER — MIDAZOLAM HCL 2 MG/2ML IJ SOLN
INTRAMUSCULAR | Status: AC
  Filled 2019-04-19: qty 2

## 2019-04-19 MED ORDER — HYDROCODONE-ACETAMINOPHEN 5-325 MG PO TABS
1.0000 | ORAL_TABLET | ORAL | Status: DC | PRN
  Administered 2019-04-19: 17:00:00 2 via ORAL
  Administered 2019-04-20: 1 via ORAL
  Filled 2019-04-19: qty 2
  Filled 2019-04-19: qty 1

## 2019-04-19 MED ORDER — FENTANYL CITRATE (PF) 100 MCG/2ML IJ SOLN
INTRAMUSCULAR | Status: AC
  Filled 2019-04-19: qty 2

## 2019-04-19 MED ORDER — LIDOCAINE 2% (20 MG/ML) 5 ML SYRINGE
INTRAMUSCULAR | Status: AC
  Filled 2019-04-19: qty 5

## 2019-04-19 MED ORDER — THROMBIN 5000 UNITS EX SOLR
CUTANEOUS | Status: AC
  Filled 2019-04-19: qty 5000

## 2019-04-19 MED ORDER — PHENYLEPHRINE HCL (PRESSORS) 10 MG/ML IV SOLN
INTRAVENOUS | Status: AC
  Filled 2019-04-19: qty 1

## 2019-04-19 MED ORDER — BISACODYL 10 MG RE SUPP: 10.0000 mg | Freq: Every day | RECTAL | Status: DC | PRN

## 2019-04-19 MED ORDER — POLYETHYLENE GLYCOL 3350 17 G PO PACK: 17.0000 g | PACK | Freq: Every day | ORAL | Status: DC | PRN

## 2019-04-19 MED ORDER — 0.9 % SODIUM CHLORIDE (POUR BTL) OPTIME
TOPICAL | Status: DC | PRN
  Administered 2019-04-19: 11:00:00 1000 mL

## 2019-04-19 MED ORDER — EPHEDRINE SULFATE 50 MG/ML IJ SOLN
INTRAMUSCULAR | Status: DC | PRN
  Administered 2019-04-19: 5 mg via INTRAVENOUS
  Administered 2019-04-19: 10 mg via INTRAVENOUS

## 2019-04-19 MED ORDER — IRBESARTAN 75 MG PO TABS
75.0000 mg | ORAL_TABLET | Freq: Every day | ORAL | Status: DC
  Administered 2019-04-20: 75 mg via ORAL
  Filled 2019-04-19 (×2): qty 1

## 2019-04-19 MED ORDER — MIDAZOLAM HCL 5 MG/5ML IJ SOLN
INTRAMUSCULAR | Status: DC | PRN
  Administered 2019-04-19: 1 mg via INTRAVENOUS

## 2019-04-19 MED ORDER — BUPIVACAINE HCL (PF) 0.5 % IJ SOLN
INTRAMUSCULAR | Status: AC
  Filled 2019-04-19: qty 30

## 2019-04-19 MED ORDER — SUCCINYLCHOLINE CHLORIDE 200 MG/10ML IV SOSY
PREFILLED_SYRINGE | INTRAVENOUS | Status: AC
  Filled 2019-04-19: qty 20

## 2019-04-19 MED ORDER — SODIUM CHLORIDE 0.9% FLUSH: 3.0000 mL | INTRAVENOUS | Status: DC | PRN

## 2019-04-19 MED ORDER — ONDANSETRON HCL 4 MG/2ML IJ SOLN
INTRAMUSCULAR | Status: DC | PRN
  Administered 2019-04-19: 4 mg via INTRAVENOUS

## 2019-04-19 MED ORDER — MENTHOL 3 MG MT LOZG: 1.0000 | LOZENGE | OROMUCOSAL | Status: DC | PRN

## 2019-04-19 MED ORDER — METHOCARBAMOL 500 MG PO TABS
500.0000 mg | ORAL_TABLET | Freq: Four times a day (QID) | ORAL | Status: DC | PRN
  Administered 2019-04-19 – 2019-04-20 (×3): 500 mg via ORAL
  Filled 2019-04-19 (×4): qty 1

## 2019-04-19 MED ORDER — PANTOPRAZOLE SODIUM 40 MG PO TBEC
80.0000 mg | DELAYED_RELEASE_TABLET | Freq: Every day | ORAL | Status: DC
  Administered 2019-04-19 – 2019-04-20 (×2): 80 mg via ORAL
  Filled 2019-04-19 (×2): qty 2

## 2019-04-19 MED ORDER — LACTATED RINGERS IV SOLN: INTRAVENOUS | Status: DC

## 2019-04-19 MED ORDER — LIDOCAINE-EPINEPHRINE 1 %-1:100000 IJ SOLN
INTRAMUSCULAR | Status: AC
  Filled 2019-04-19: qty 1

## 2019-04-19 MED ORDER — ONDANSETRON HCL 4 MG PO TABS: 4.0000 mg | ORAL_TABLET | Freq: Four times a day (QID) | ORAL | Status: DC | PRN

## 2019-04-19 MED ORDER — AMLODIPINE BESYLATE-VALSARTAN 10-160 MG PO TABS: 0.5000 | ORAL_TABLET | Freq: Every day | ORAL | Status: DC

## 2019-04-19 MED ORDER — SENNA 8.6 MG PO TABS
1.0000 | ORAL_TABLET | Freq: Two times a day (BID) | ORAL | Status: DC
  Administered 2019-04-19 – 2019-04-20 (×3): 8.6 mg via ORAL
  Filled 2019-04-19 (×3): qty 1

## 2019-04-19 MED ORDER — ALPRAZOLAM 0.5 MG PO TABS
1.0000 mg | ORAL_TABLET | Freq: Three times a day (TID) | ORAL | Status: DC | PRN
  Administered 2019-04-19: 20:00:00 1 mg via ORAL
  Filled 2019-04-19: qty 2

## 2019-04-19 MED ORDER — ROCURONIUM BROMIDE 10 MG/ML (PF) SYRINGE
PREFILLED_SYRINGE | INTRAVENOUS | Status: DC | PRN
  Administered 2019-04-19: 20 mg via INTRAVENOUS
  Administered 2019-04-19: 60 mg via INTRAVENOUS

## 2019-04-19 MED ORDER — EPHEDRINE 5 MG/ML INJ
INTRAVENOUS | Status: AC
  Filled 2019-04-19: qty 20

## 2019-04-19 MED ORDER — PROPOFOL 10 MG/ML IV BOLUS
INTRAVENOUS | Status: DC | PRN
  Administered 2019-04-19: 150 mg via INTRAVENOUS

## 2019-04-19 MED ORDER — PHENYLEPHRINE HCL-NACL 10-0.9 MG/250ML-% IV SOLN
INTRAVENOUS | Status: DC | PRN
  Administered 2019-04-19: 25 ug/min via INTRAVENOUS

## 2019-04-19 MED ORDER — HYDROCODONE-ACETAMINOPHEN 5-325 MG PO TABS
0.5000 | ORAL_TABLET | Freq: Four times a day (QID) | ORAL | Status: DC | PRN
  Administered 2019-04-20: 1 via ORAL
  Filled 2019-04-19: qty 1

## 2019-04-19 MED ORDER — THROMBIN 5000 UNITS EX SOLR
OROMUCOSAL | Status: DC | PRN
  Administered 2019-04-19: 11:00:00 5 mL via TOPICAL

## 2019-04-19 MED ORDER — PROPOFOL 10 MG/ML IV BOLUS
INTRAVENOUS | Status: AC
  Filled 2019-04-19: qty 20

## 2019-04-19 MED ORDER — LIDOCAINE 2% (20 MG/ML) 5 ML SYRINGE
INTRAMUSCULAR | Status: DC | PRN
  Administered 2019-04-19: 40 mg via INTRAVENOUS

## 2019-04-19 MED ORDER — METHOCARBAMOL 500 MG PO TABS
ORAL_TABLET | ORAL | Status: AC
  Filled 2019-04-19: qty 1

## 2019-04-19 MED ORDER — FENTANYL CITRATE (PF) 250 MCG/5ML IJ SOLN
INTRAMUSCULAR | Status: DC | PRN
  Administered 2019-04-19: 100 ug via INTRAVENOUS
  Administered 2019-04-19: 50 ug via INTRAVENOUS

## 2019-04-19 MED ORDER — LIDOCAINE-EPINEPHRINE 1 %-1:100000 IJ SOLN
INTRAMUSCULAR | Status: DC | PRN
  Administered 2019-04-19: 3.5 mL

## 2019-04-19 MED ORDER — POLYETHYL GLYCOL-PROPYL GLYCOL 0.4-0.3 % OP SOLN: 1.0000 [drp] | OPHTHALMIC | Status: DC | PRN

## 2019-04-19 MED ORDER — MORPHINE SULFATE (PF) 2 MG/ML IV SOLN: 2.0000 mg | INTRAVENOUS | Status: DC | PRN

## 2019-04-19 MED ORDER — ONDANSETRON HCL 4 MG/2ML IJ SOLN: 4.0000 mg | Freq: Four times a day (QID) | INTRAMUSCULAR | Status: DC | PRN

## 2019-04-19 MED ORDER — FLEET ENEMA 7-19 GM/118ML RE ENEM: 1.0000 | ENEMA | Freq: Once | RECTAL | Status: DC | PRN

## 2019-04-19 MED ORDER — BUPIVACAINE HCL (PF) 0.5 % IJ SOLN
INTRAMUSCULAR | Status: DC | PRN
  Administered 2019-04-19: 3.5 mL

## 2019-04-19 MED ORDER — CITALOPRAM HYDROBROMIDE 20 MG PO TABS
30.0000 mg | ORAL_TABLET | Freq: Every day | ORAL | Status: DC
  Administered 2019-04-19 – 2019-04-20 (×2): 30 mg via ORAL
  Filled 2019-04-19 (×2): qty 2

## 2019-04-19 MED ORDER — METHOCARBAMOL 1000 MG/10ML IJ SOLN
500.0000 mg | Freq: Four times a day (QID) | INTRAVENOUS | Status: DC | PRN
  Filled 2019-04-19: qty 5

## 2019-04-19 MED ORDER — ALUM & MAG HYDROXIDE-SIMETH 200-200-20 MG/5ML PO SUSP: 30.0000 mL | Freq: Four times a day (QID) | ORAL | Status: DC | PRN

## 2019-04-19 MED ORDER — FENTANYL CITRATE (PF) 250 MCG/5ML IJ SOLN
INTRAMUSCULAR | Status: AC
  Filled 2019-04-19: qty 5

## 2019-04-19 MED ORDER — CEFAZOLIN SODIUM-DEXTROSE 2-4 GM/100ML-% IV SOLN
2.0000 g | INTRAVENOUS | Status: AC
  Administered 2019-04-19: 2 g via INTRAVENOUS
  Filled 2019-04-19: qty 100

## 2019-04-19 MED ORDER — DEXAMETHASONE SODIUM PHOSPHATE 10 MG/ML IJ SOLN
INTRAMUSCULAR | Status: DC | PRN
  Administered 2019-04-19: 10 mg via INTRAVENOUS

## 2019-04-19 MED ORDER — SODIUM CHLORIDE 0.9 % IV SOLN: 250.0000 mL | INTRAVENOUS | Status: DC

## 2019-04-19 MED ORDER — SODIUM CHLORIDE 0.9% FLUSH
3.0000 mL | Freq: Two times a day (BID) | INTRAVENOUS | Status: DC
  Administered 2019-04-19: 3 mL via INTRAVENOUS

## 2019-04-19 MED ORDER — AMLODIPINE BESYLATE 5 MG PO TABS
5.0000 mg | ORAL_TABLET | Freq: Every day | ORAL | Status: DC
  Administered 2019-04-20: 5 mg via ORAL
  Filled 2019-04-19 (×2): qty 1

## 2019-04-19 MED ORDER — SUGAMMADEX SODIUM 200 MG/2ML IV SOLN
INTRAVENOUS | Status: DC | PRN
  Administered 2019-04-19: 150 mg via INTRAVENOUS

## 2019-04-19 SURGICAL SUPPLY — 49 items
ADH SKN CLS APL DERMABOND .7 (GAUZE/BANDAGES/DRESSINGS) ×1
BAG DECANTER FOR FLEXI CONT (MISCELLANEOUS) ×3 IMPLANT
BIT DRILL ACP 13 (DRILL) IMPLANT
BIT DRILL NEURO 2X3.1 SFT TUCH (MISCELLANEOUS) ×1 IMPLANT
BNDG GAUZE ELAST 4 BULKY (GAUZE/BANDAGES/DRESSINGS) IMPLANT
BONE MATRIX OSTEOCEL PRO SM (Bone Implant) ×2 IMPLANT
BUR BARREL STRAIGHT FLUTE 4.0 (BURR) ×2 IMPLANT
BUR CROSS CUT FISSURE 1.2 (BURR) ×3 IMPLANT
BUR CROSS CUT FISSURE 1.2MM (BURR) ×3
CAGE CERV MOD 6X17X14 7D (Cage) ×2 IMPLANT
CANISTER SUCT 3000ML PPV (MISCELLANEOUS) ×3 IMPLANT
COVER WAND RF STERILE (DRAPES) ×3 IMPLANT
DECANTER SPIKE VIAL GLASS SM (MISCELLANEOUS) ×3 IMPLANT
DERMABOND ADVANCED (GAUZE/BANDAGES/DRESSINGS) ×2
DERMABOND ADVANCED .7 DNX12 (GAUZE/BANDAGES/DRESSINGS) ×1 IMPLANT
DRAPE LAPAROTOMY 100X72 PEDS (DRAPES) ×3 IMPLANT
DRAPE MICROSCOPE LEICA (MISCELLANEOUS) IMPLANT
DRILL ACP 13 (DRILL) ×3
DRILL NEURO 2X3.1 SOFT TOUCH (MISCELLANEOUS) ×3
DURAPREP 6ML APPLICATOR 50/CS (WOUND CARE) ×3 IMPLANT
ELECT REM PT RETURN 9FT ADLT (ELECTROSURGICAL) ×3
ELECTRODE REM PT RTRN 9FT ADLT (ELECTROSURGICAL) ×1 IMPLANT
GAUZE 4X4 16PLY RFD (DISPOSABLE) IMPLANT
GLOVE BIOGEL PI IND STRL 8.5 (GLOVE) ×1 IMPLANT
GLOVE BIOGEL PI INDICATOR 8.5 (GLOVE) ×2
GLOVE ECLIPSE 8.5 STRL (GLOVE) ×3 IMPLANT
GOWN STRL REUS W/ TWL LRG LVL3 (GOWN DISPOSABLE) IMPLANT
GOWN STRL REUS W/ TWL XL LVL3 (GOWN DISPOSABLE) ×1 IMPLANT
GOWN STRL REUS W/TWL 2XL LVL3 (GOWN DISPOSABLE) ×3 IMPLANT
GOWN STRL REUS W/TWL LRG LVL3 (GOWN DISPOSABLE)
GOWN STRL REUS W/TWL XL LVL3 (GOWN DISPOSABLE) ×3
HALTER HD/CHIN CERV TRACTION D (MISCELLANEOUS) ×3 IMPLANT
HEMOSTAT POWDER KIT SURGIFOAM (HEMOSTASIS) ×3 IMPLANT
KIT BASIN OR (CUSTOM PROCEDURE TRAY) ×3 IMPLANT
NDL SPNL 22GX3.5 QUINCKE BK (NEEDLE) ×1 IMPLANT
NEEDLE HYPO 22GX1.5 SAFETY (NEEDLE) ×3 IMPLANT
NEEDLE SPNL 22GX3.5 QUINCKE BK (NEEDLE) ×3 IMPLANT
NS IRRIG 1000ML POUR BTL (IV SOLUTION) ×3 IMPLANT
PACK LAMINECTOMY NEURO (CUSTOM PROCEDURE TRAY) ×3 IMPLANT
PAD ARMBOARD 7.5X6 YLW CONV (MISCELLANEOUS) ×3 IMPLANT
PLATE ACP 1.6X24 1LVL (Plate) ×2 IMPLANT
RASP 3.0MM (RASP) IMPLANT
RUBBERBAND STERILE (MISCELLANEOUS) IMPLANT
SCREW ACP ST VARI 3.5X13 (Screw) ×8 IMPLANT
SPONGE INTESTINAL PEANUT (DISPOSABLE) ×3 IMPLANT
SUT VIC AB 4-0 RB1 18 (SUTURE) ×3 IMPLANT
TOWEL GREEN STERILE (TOWEL DISPOSABLE) ×3 IMPLANT
TOWEL GREEN STERILE FF (TOWEL DISPOSABLE) ×3 IMPLANT
WATER STERILE IRR 1000ML POUR (IV SOLUTION) ×3 IMPLANT

## 2019-04-19 NOTE — Anesthesia Postprocedure Evaluation (Signed)
Anesthesia Post Note  Patient: Candice Hunt  Procedure(s) Performed: Cervical three-four Anterior cervical decompression/discectomy/fusion (N/A )     Patient location during evaluation: PACU Anesthesia Type: General Level of consciousness: awake and alert Pain management: pain level controlled Vital Signs Assessment: post-procedure vital signs reviewed and stable Respiratory status: spontaneous breathing, nonlabored ventilation, respiratory function stable and patient connected to nasal cannula oxygen Cardiovascular status: blood pressure returned to baseline and stable Postop Assessment: no apparent nausea or vomiting Anesthetic complications: no    Last Vitals:  Vitals:   04/19/19 1355 04/19/19 1427  BP: (!) 155/69 (!) 162/75  Pulse: 83 85  Resp: 12 20  Temp:  36.6 C  SpO2: 92% 98%    Last Pain:  Vitals:   04/19/19 1450  TempSrc:   PainSc: Asleep                 Courtni Balash COKER

## 2019-04-19 NOTE — Transfer of Care (Signed)
Immediate Anesthesia Transfer of Care Note  Patient: Candice Hunt  Procedure(s) Performed: Cervical three-four Anterior cervical decompression/discectomy/fusion (N/A )  Patient Location: PACU  Anesthesia Type:General  Level of Consciousness: awake, alert  and oriented  Airway & Oxygen Therapy: Patient Spontanous Breathing and Patient connected to face mask oxygen  Post-op Assessment: Report given to RN and Post -op Vital signs reviewed and stable  Post vital signs: Reviewed and stable  Last Vitals:  Vitals Value Taken Time  BP 154/74 04/19/19 1256  Temp    Pulse 85 04/19/19 1258  Resp 12 04/19/19 1258  SpO2 100 % 04/19/19 1258  Vitals shown include unvalidated device data.  Last Pain:  Vitals:   04/19/19 1007  TempSrc:   PainSc: 3          Complications: No apparent anesthesia complications

## 2019-04-19 NOTE — Anesthesia Procedure Notes (Addendum)
Procedure Name: Intubation Date/Time: 04/19/2019 11:22 AM Performed by: Mariea Clonts, CRNA Pre-anesthesia Checklist: Patient identified, Emergency Drugs available, Suction available and Patient being monitored Patient Re-evaluated:Patient Re-evaluated prior to induction Oxygen Delivery Method: Circle System Utilized Preoxygenation: Pre-oxygenation with 100% oxygen Induction Type: IV induction Ventilation: Mask ventilation without difficulty and Oral airway inserted - appropriate to patient size Laryngoscope Size: Glidescope and 3 Grade View: Grade II Tube type: Oral Tube size: 7.0 mm Number of attempts: 1 Airway Equipment and Method: Stylet and Oral airway Placement Confirmation: ETT inserted through vocal cords under direct vision,  positive ETCO2 and breath sounds checked- equal and bilateral Tube secured with: Tape Dental Injury: Teeth and Oropharynx as per pre-operative assessment

## 2019-04-19 NOTE — Evaluation (Signed)
Physical Therapy Evaluation Patient Details Name: Candice Hunt MRN: [id number] DOB: Oct 07, 1950 Today's Date: 04/19/2019   History of Present Illness  pt is a 69 y/o female with h/o panic attacks amd  HTN with a long h/o neck, shoulder and arm pain, pt s/p ACDF at C34 with allografting and anterior plate fixation  [id number]  Clinical Impression  Pt admitted with/for ACDF due to pain.  Pt moving at a minimal assist level, but should make good progress toward being independent soon.  Cervical education initiated with pt realizing that she may not have enough assist initially for the mundane ADL tasks at home..  Pt currently limited functionally due to the problems listed below.  (see problems list.)  Pt will benefit from PT to maximize function and safety to be able to get home safely with limited available assist.     Follow Up Recommendations No PT follow up;Other (comment)    Equipment Recommendations  None recommended by PT    Recommendations for Other Services       Precautions / Restrictions Precautions Precautions: Fall      Mobility  Bed Mobility Overal bed mobility: Needs Assistance Bed Mobility: Sit to Sidelying         Sit to sidelying: Min assist General bed mobility comments: cues for technique and assist of legs into bed  Transfers Overall transfer level: Needs assistance   Transfers: Sit to/from Stand Sit to Stand: Min assist         General transfer comment: cues for hand placement safety  Ambulation/Gait Ambulation/Gait assistance: Min guard;Min assist Gait Distance (Feet): 300 Feet Assistive device: Straight cane Gait Pattern/deviations: Step-through pattern Gait velocity: slow Gait velocity interpretation: <1.8 ft/sec, indicate of risk for recurrent falls General Gait Details: generally steady with a cane with occasional episode of unsteadiness trying to turn her body to look around.  Stairs Stairs: Yes Stairs assistance: Min guard Stair  Management: One rail Right;Step to pattern;Forwards Number of Stairs: 3 General stair comments: safe with rail, but doesn't have rails.  Get her contractor to put some in.  Wheelchair Mobility    Modified Rankin (Stroke Patients Only)       Balance Overall balance assessment: Mild deficits observed, not formally tested                                           Pertinent Vitals/Pain Pain Assessment: Faces Faces Pain Scale: Hurts little more Pain Location: incision Pain Descriptors / Indicators: Discomfort;Grimacing;Sore Pain Intervention(s): Monitored during session    Home Living Family/patient expects to be discharged to:: Private residence Living Arrangements: Alone Available Help at Discharge: Friend(s);Available PRN/intermittently Type of Home: House Home Access: Stairs to enter   [employer] of Steps: 1 Home Layout: One level Home Equipment: Cane - single point      Prior Function Level of Independence: Independent with assistive device(s)               Hand Dominance        Extremity/Trunk Assessment   Upper Extremity Assessment Upper Extremity Assessment: Defer to OT evaluation    Lower Extremity Assessment Lower Extremity Assessment: Overall WFL for tasks assessed       Communication   Communication: No difficulties  Cognition Arousal/Alertness: Awake/alert Behavior During Therapy: WFL for tasks assessed/performed Overall Cognitive Status: Within Functional Limits for tasks assessed  General Comments General comments (skin integrity, edema, etc.): pt instructed in neck care/prec, log roll, lifting precautions and progression of activity.    Exercises     Assessment/Plan    PT Assessment Patient needs continued PT services  PT Problem List Decreased activity tolerance;Decreased balance;Decreased mobility;Decreased knowledge of precautions       PT  Treatment Interventions DME instruction;Gait training;Functional mobility training;Therapeutic activities;Stair training;Patient/family education    PT Goals (Current goals can be found in the Care Plan section)  Acute Rehab PT Goals Patient Stated Goal: go home. PT Goal Formulation: With patient Time For Goal Achievement: 04/26/19 Potential to Achieve Goals: Good    Frequency Min 5X/week   Barriers to discharge        Co-evaluation               AM-PAC PT "6 Clicks" Mobility  Outcome Measure Help needed turning from your back to your side while in a flat bed without using bedrails?: A Little Help needed moving from lying on your back to sitting on the side of a flat bed without using bedrails?: A Little Help needed moving to and from a bed to a chair (including a wheelchair)?: A Little Help needed standing up from a chair using your arms (e.g., wheelchair or bedside chair)?: A Little Help needed to walk in hospital room?: A Little Help needed climbing 3-5 steps with a railing? : A Little 6 Click Score: 18    End of Session   Activity Tolerance: Patient tolerated treatment well Patient left: in bed;with call bell/phone within reach Nurse Communication: Mobility status PT Visit Diagnosis: Other abnormalities of gait and mobility (R26.89);Pain Pain - part of body: (anterior neck)    Time: [id number] PT Time Calculation (min) (ACUTE ONLY): 30 min   Charges:   PT Evaluation $PT Eval Moderate Complexity: 1 Mod PT Treatments $Gait Training: 8-22 mins        04/19/2019  Ginger Carne., PT Acute Rehabilitation Services [phone]  (pager) [phone]  (office)  Tessie Fass Indiya Izquierdo 04/19/2019, 6:38 PM

## 2019-04-19 NOTE — Op Note (Signed)
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  History of fusion C4-T1. Postoperative diagnosis: Same Procedure: Anterior cervical decompression C3-C4 arthrodesis with allograft and structural titanium cage, anterior plate fixation [id number].  Removal of superior portion of old fixation from C4. Surgeon: Kristeen Miss First Assistant: Erline Levine, MD Anesthesia: General endotracheal Indications: Candice Hunt is a 69 year old individual who has had significant cervical radiculopathy and early signs of myelopathy with weakness in her lower extremities she was found to have cord compression at C3-C4 from adjacent level disease above the fusion from C4-T1.  She is advised regarding the need for surgical decompression and stabilization of the C3-C4 joint.  Procedure: Patient was brought to the operating room placed on table supine position the head was secured in a donut and a naturally extended fashion and the neck was then prepped with alcohol DuraPrep and draped in a sterile fashion transverse incision was made in the superior portion of the neck after administering 5 cc of lidocaine with epinephrine mixed with half percent Marcaine.  The dissection was carried down through the platysma the plane between the sternocleidomastoid and the strap muscles dissected bluntly into the prevertebral space was reached.  First identifiable disc space was noted to be that of C3-4 just above the old plate.  Superior portion of the old plate was then dissected free of fascial tissue and bone and the screws were removed.  Then using a high-speed metal cutting bit we transected the plate just below the level of the screws.  This portion was removed and the area was secured with a number of cottonoids to prevent significant metal filings being spread about and what ever metal filings were embedded in the soft tissues were then stripped carefully to remove the vast  majority of them.  Once this was accomplished the area was inspected and some ventral osteophytes were removed from around the region of the plate the 075-GRM disc space was then entered with a #15 blade and a combination of curettes and rongeurs was used to evacuate a moderate amount of severely desiccated disc material from this region osteophytes from the inferior margin of C3 were then shaved down and at this space was opened further the lateral recesses were then decompressed using a high-speed drill and a 2 mm matchstick bur.  Ultimately the central canal and lateral recesses were decompressed using 2 mm Kerrison punch to finish the job completely.  The stasis from epidural bleeding veins was controlled with bipolar cautery and some small pledgets of Gelfoam which were irrigated away in the end once decompression was completed the interspace was sized for an appropriate size spacer a modulus 6 mm lordotic spacer was then used as it fit well into this interval and allowed for minimal distraction.  This was filled with 1 cc of osteocell.  The titanium spacer was then inserted into the interspace and when countersunk appropriately the remainder of the ostia cell bone was packed into the lateral gutters and the uncinate spurs that had been removed.  Once this was completed a 24 mm anterior cervical plate of of NuVasive variety was secured with 13 mm variable angle self locking screws.  Final radiograph identified good position of the plate the hardware and the decompression.  Hemostasis in the soft tissues was then meticulously obtained the area was checked to make sure all the screws were locked into position and then the platysma was closed with 3-0 Vicryl in interrupted fashion 4-0 Vicryl was  used to close the subcuticular skin.  Patient tolerated procedure well is returned to recovery room in stable condition.

## 2019-04-19 NOTE — H&P (Signed)
Chief complaint: Neck shoulder and arm pain History of present illness: Ms. Candice Hunt had had a long cervical decompression and fusion.  Candice Hunt has significant spondylosis throughout the neck and also in the lumbar spine, but here lately, she has been having increasing neck pain, headache, dysesthesias in the upper extremities and generalized weakness in the arms and the legs.  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  IMAGING: Today in the office to further her workup, I obtained a lateral C-spine with flexion-extension views in addition to a singular AP view.  This demonstrates that she does have a 2 mm anterolisthesis at C3-C4 that is fairly fixed.  The plate, though, does ride to nearly the top of C4.  PAST MEDICAL HISTORY: Her past medical history has remained stable, although Candice Hunt did tell me that she had a history of a malignant melanoma that had been treated a number of years ago.  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  PHYSICAL EXAMINATION: On examination, I note that her range of motion demonstrates that she has a turning radius of about 30 degrees, left and right.  She flexes and extends about 50% of normal.  Motor strength appears good in the deltoids, the biceps, the triceps, the grips, and intrinsics, but all decreased about a grade of 4/5.  Her station and gait are intact.  She does walk with a bit of antalgia on that right side.  Heart has regular rate and rhythm the abdomen is soft bowel sounds positive no masses are noted extremities reveal no cyanosis clubbing or edema.  Station and gait are intact cranial nerve examination is normal.  ASSESSMENT AND PLAN: I suggested to Candice Hunt at this point that she will require surgical decompression and stabilization of C3-4.  The degree of cord compression that is present is  substantial.  Though there is no intrinsic cord signal change, her symptoms are clearly myelopathic.  We will plan to do the surgery at her earliest convenience.  I noted that this would be done similar to her other surgery through an anterior approach, though the incision would be much higher up.  We would remove only the superior portion of the plate at C4 in order to access the C3-4 disc space and stabilize it.  We will plan the surgery at the earliest convenience.

## 2019-04-20 DIAGNOSIS — M4712 Other spondylosis with myelopathy, cervical region: Secondary | ICD-10-CM | POA: Diagnosis not present

## 2019-04-20 MED ORDER — DIAZEPAM 5 MG PO TABS: 5.0000 mg | ORAL_TABLET | Freq: Four times a day (QID) | ORAL | 0 refills | Status: AC | PRN

## 2019-04-20 MED ORDER — HYDROCODONE-ACETAMINOPHEN 5-325 MG PO TABS: 1.0000 | ORAL_TABLET | ORAL | 0 refills | Status: AC | PRN

## 2019-04-20 NOTE — Plan of Care (Signed)
Patient alert and oriented, mae's well, voiding adequate amount of urine, swallowing without difficulty, no c/o pain at time of discharge. Patient discharged home with family. Script and discharged instructions given to patient. Patient and family stated understanding of instructions given. Patient has an appointment with Dr. Elsner  

## 2019-04-20 NOTE — Progress Notes (Signed)
Physical Therapy Treatment Patient Details Name: Candice Hunt MRN: [id number] DOB: 03/29/1950 Today's Date: 04/20/2019    History of Present Illness 69 y/o female with h/o panic attacks amd  HTN with a long h/o neck, shoulder and arm pain, pt s/p ACDF at [facility] with allografting and anterior plate fixation [id number]    PT Comments    [name] continues to make progress with functional mobility and activity tolerance. She ambulated ~300' with straight cane and min guard for safety, no LOB noted. She naviagted 3 steps with 1 R rail with min guard for safety. Educated, demonstrated, and reviewed cervical precautions as they relate to functional mobility and ADL's.  Pt verbalized and demonstrated good aherence to cervical precautions throughout session. Recommend d/c home once medically appropriate as pt is ready to d/c from PT perspective.   Follow Up Recommendations  No PT follow up;Supervision - Intermittent     Equipment Recommendations  None recommended by PT    Recommendations for Other Services       Precautions / Restrictions Precautions Precautions: Fall;Cervical Precaution Booklet Issued: Yes (comment) Precaution Comments: Reviewed cervical precautions Required Braces or Orthoses: Other Brace Other Brace: No brace per MD Restrictions Weight Bearing Restrictions: No    Mobility  Bed Mobility Overal bed mobility: (OOB in recliner )             General bed mobility comments: Pt sitting at EOB upon arrival. Pt verbalizing log roll technique  Transfers Overall transfer level: Needs assistance Equipment used: Straight cane Transfers: Sit to/from Stand Sit to Stand: Supervision         General transfer comment: Supervision for safety  Ambulation/Gait Ambulation/Gait assistance: Min guard Gait Distance (Feet): 300 Feet Assistive device: Straight cane Gait Pattern/deviations: Step-through pattern Gait velocity: dec   General Gait Details: min guard for safety,  slows gait when turnin gbody to talk. No LOB noted   Stairs Stairs: Yes Stairs assistance: Min guard Stair Management: One rail Right;Step to pattern;Forwards Number of Stairs: 3 General stair comments: Safe with R rail- though doesnt have a rail and is having one put in. Leads with R LE   Wheelchair Mobility    Modified Rankin (Stroke Patients Only)       Balance Overall balance assessment: Mild deficits observed, not formally tested                                          Cognition Arousal/Alertness: Awake/alert Behavior During Therapy: WFL for tasks assessed/performed Overall Cognitive Status: Within Functional Limits for tasks assessed                                        Exercises      General Comments General comments (skin integrity, edema, etc.): Educated, demonstrated, and reviewed cervical precautions in relation to functional mobility and ADL's      Pertinent Vitals/Pain Pain Assessment: Faces Faces Pain Scale: Hurts little more Pain Location: posterior neck Pain Descriptors / Indicators: Discomfort;Sore;Operative site guarding Pain Intervention(s): Monitored during session    Home Living Family/patient expects to be discharged to:: Private residence Living Arrangements: Alone Available Help at Discharge: Friend(s);Available PRN/intermittently;Personal care attendant Type of Home: House Home Access: Stairs to enter   Home Layout: One level Home Equipment: Cane - single point  Additional Comments: Pt planning for an aide to stay at dc and assist with IADLs    Prior Function Level of Independence: Independent with assistive device(s)      Comments: Occationally using cane as needed   PT Goals (current goals can now be found in the care plan section) Acute Rehab PT Goals Patient Stated Goal: to get home PT Goal Formulation: With patient Time For Goal Achievement: 04/26/19 Potential to Achieve Goals:  Good Progress towards PT goals: Progressing toward goals    Frequency    Min 5X/week      PT Plan Current plan remains appropriate    Co-evaluation              AM-PAC PT "6 Clicks" Mobility   Outcome Measure  Help needed turning from your back to your side while in a flat bed without using bedrails?: A Little Help needed moving from lying on your back to sitting on the side of a flat bed without using bedrails?: A Little Help needed moving to and from a bed to a chair (including a wheelchair)?: A Little Help needed standing up from a chair using your arms (e.g., wheelchair or bedside chair)?: None Help needed to walk in hospital room?: A Little Help needed climbing 3-5 steps with a railing? : A Little 6 Click Score: 19    End of Session   Activity Tolerance: Patient tolerated treatment well Patient left: in bed;with call bell/phone within reach Nurse Communication: Mobility status PT Visit Diagnosis: Other abnormalities of gait and mobility (R26.89);Pain Pain - part of body: (neck)     Time: [id number] PT Time Calculation (min) (ACUTE ONLY): 17 min  Charges:  $Gait Training: 8-22 mins                     Akaiya Touchette, SPT Acute Rehab  [id number]    Nadezhda Pollitt 04/20/2019, 11:03 AM

## 2019-04-20 NOTE — Evaluation (Addendum)
Occupational Therapy Evaluation Patient Details Name: Candice Hunt MRN: [id number] DOB: 1950/12/06 Today's Date: 04/20/2019    History of Present Illness 69 y/o female with h/o panic attacks amd  HTN with a long h/o neck, shoulder and arm pain, pt s/p ACDF at [facility] with allografting and anterior plate fixation L3-8   Clinical Impression   PTA, pt was living alone and was independent with use of cane as needed. Currently, pt performing ADLs and functional mobility at Supervision level. Provided education and handout on cervical precautions, bed mobility, grooming, UB ADLs, LB ADLs, toileting, and tub transfer; pt demonstrated and verbalized understanding. Answered all pt questions. Recommend dc home once medically stable per physician. All acute OT needs met and will sign off. Thank you.    Follow Up Recommendations  No OT follow up;Supervision - Intermittent    Equipment Recommendations  None recommended by OT    Recommendations for Other Services PT consult     Precautions / Restrictions Precautions Precautions: Fall;Cervical Precaution Booklet Issued: Yes (comment) Precaution Comments: Reviewed cervical precautions Required Braces or Orthoses: Other Brace Other Brace: No brace per MD Restrictions Weight Bearing Restrictions: No      Mobility Bed Mobility               General bed mobility comments: Pt sitting at EOB upon arrival. Pt verbalizing log roll technique  Transfers Overall transfer level: Needs assistance Equipment used: Straight cane Transfers: Sit to/from Stand Sit to Stand: Supervision         General transfer comment: Supervision for safety    Balance Overall balance assessment: Mild deficits observed, not formally tested                                         ADL either performed or assessed with clinical judgement   ADL Overall ADL's : Needs assistance/impaired Eating/Feeding: Set up;Sitting   Grooming: Set  up;Standing Grooming Details (indicate cue type and reason): Providing education on compensatory techniques for oral care Upper Body Bathing: Set up;Supervision/ safety;Sitting   Lower Body Bathing: Supervison/ safety;Sit to/from stand   Upper Body Dressing : Set up;Sitting Upper Body Dressing Details (indicate cue type and reason): Educating pt on compensatory techniques for UB dressing. Pt donning dress Lower Body Dressing: Supervision/safety;Set up;Sit to/from stand Lower Body Dressing Details (indicate cue type and reason): Pt demonstrating figure four technique and able to don/doff socks Toilet Transfer: Supervision/safety;Ambulation(simulated to recliner with cane) Toilet Transfer Details (indicate cue type and reason): Supervision for safety     Tub/ Shower Transfer: Min guard;Tub transfer;Ambulation Tub/Shower Transfer Details (indicate cue type and reason): Educating pt on safe tub transfer. Pt performing simulated tub transfer with MIn Guard A for safety Functional mobility during ADLs: Supervision/safety;Cane General ADL Comments: Providing education and handout on cervical precautions, bed mobility, grooming, UB ADLs, LB ADLs, toileting, and tub transfer.     Vision Baseline Vision/History: Wears glasses Wears Glasses: At all times Patient Visual Report: No change from baseline       Perception     Praxis      Pertinent Vitals/Pain Pain Assessment: Faces Faces Pain Scale: Hurts little more Pain Location: incision Pain Descriptors / Indicators: Discomfort;Grimacing;Sore Pain Intervention(s): Limited activity within patient's tolerance;Monitored during session;Repositioned     Hand Dominance Right   Extremity/Trunk Assessment Upper Extremity Assessment Upper Extremity Assessment: Overall WFL for tasks assessed  Lower Extremity Assessment Lower Extremity Assessment: Defer to PT evaluation   Cervical / Trunk Assessment Cervical / Trunk Assessment: Other  exceptions Cervical / Trunk Exceptions: s/p ACDF   Communication Communication Communication: No difficulties   Cognition Arousal/Alertness: Awake/alert Behavior During Therapy: WFL for tasks assessed/performed Overall Cognitive Status: Within Functional Limits for tasks assessed                                     General Comments  Educating pt on scar management (after 2 week period for sx site to heal). Pt appreciative of information    Exercises     Shoulder Instructions      Home Living Family/patient expects to be discharged to:: Private residence Living Arrangements: Alone Available Help at Discharge: Friend(s);Available PRN/intermittently;Personal care attendant Type of Home: House Home Access: Stairs to enter Entrance Stairs-Number of Steps: 1   Home Layout: One level Alternate Level Stairs-Number of Steps: 1-3 steps within the home without rail   Bathroom Shower/Tub: Tub/shower unit;Walk-in shower(Awaiting renovation for walk in shower)   Bathroom Toilet: [place]: Cane - single point   Additional Comments: Pt planning for an aide to stay at dc and assist with IADLs      Prior Functioning/Environment Level of Independence: Independent with assistive device(s)        Comments: Occationally using cane as needed        OT Problem List: Decreased activity tolerance;Impaired balance (sitting and/or standing);Decreased knowledge of precautions;Decreased knowledge of use of DME or AE;Pain      OT Treatment/Interventions:      OT Goals(Current goals can be found in the care plan section) Acute Rehab OT Goals Patient Stated Goal: go home. OT Goal Formulation: All assessment and education complete, DC therapy  OT Frequency:     Barriers to D/C:            Co-evaluation              AM-PAC OT "6 Clicks" Daily Activity     Outcome Measure Help from another person eating meals?: A Little Help from another person  taking care of personal grooming?: A Little Help from another person toileting, which includes using toliet, bedpan, or urinal?: A Little Help from another person bathing (including washing, rinsing, drying)?: A Little Help from another person to put on and taking off regular upper body clothing?: A Little Help from another person to put on and taking off regular lower body clothing?: A Little 6 Click Score: 18   End of Session Equipment Utilized During Treatment: Other (comment)(cane) Nurse Communication: Mobility status  Activity Tolerance: Patient tolerated treatment well Patient left: in chair;with call bell/phone within reach(with PT)  OT Visit Diagnosis: Unsteadiness on feet (R26.81);Other abnormalities of gait and mobility (R26.89);Muscle weakness (generalized) (M62.81);Pain Pain - part of body: (Neck)                Time: 2993-7169 OT Time Calculation (min): 22 min Charges:  OT General Charges $OT Visit: 1 Visit OT Evaluation $OT Eval Low Complexity: [place], OTR/L Acute Rehab Pager: [phone] Office: [place] 04/20/2019, 8:29 AM

## 2019-04-20 NOTE — Progress Notes (Signed)
PT Progress Note for Charges    04/20/19 1000  PT Visit Information  Last PT Received On 04/20/19  PT General Charges  $$ ACUTE PT VISIT 1 Visit  PT Treatments  $Gait Training 8-22 mins  Anastasio Champion, DPT  Acute Rehabilitation Services Pager [phone] Office [phone]

## 2019-04-20 NOTE — Discharge Instructions (Signed)
Wound Care Leave incision open to air. You may shower. Do not scrub directly on incision.  Do not put any creams, lotions, or ointments on incision. REMOVE GLUE IN 2 WEEKS Activity Walk each and every day, increasing distance each day. No lifting greater than 5 lbs.  Avoid excessive neck motion. No driving for 2 weeks; may ride as a passenger locally.  Diet Resume your normal diet.  Return to Work Will be discussed at you follow up appointment. Call Your Doctor If Any of These Occur Redness, drainage, or swelling at the wound.  Temperature greater than 101 degrees. Severe pain not relieved by pain medication. Increased difficulty swallowing. Incision starts to come apart. Follow Up Appt Call  [phone])  for problems.  If you have any hardware placed in your spine, you will need an x-ray before your appointment.

## 2019-04-23 ENCOUNTER — Encounter: Payer: Self-pay | Admitting: *Deleted

## 2019-04-23 NOTE — Discharge Summary (Signed)
Physician Discharge Summary  Patient ID: Candice Hunt MRN: [id number] DOB/AGE: 07-31-1950 69 y.o.  Admit date: 04/19/2019 Discharge date: 04/20/2019 Admission Diagnoses: Cervical spondylosis with myelopathy C3-C4.  History of fusion C4-T1  Discharge Diagnoses: Cervical spondylosis with myelopathy C3-C4.  History of fusion C4-T1. Active Problems:   Cervical myelopathy with cervical radiculopathy   Discharged Condition: good  Hospital Course: Patient was admitted to undergo anterior cervical decompression arthrodesis C3-C4 she tolerated surgery well.  She is discharged home.  Consults: None  Significant Diagnostic Studies: None  Treatments: surgery: Anterior cervical decompression C3-C4 arthrodesis with structural cage and allograft anterior plate fixation [id number].  Discharge Exam: Blood pressure (!) 163/74, pulse 80, temperature 99.2 F (37.3 C), temperature source Oral, resp. rate 18, height 5\' 1"  (1.549 m), weight 62.7 kg, SpO2 100 %. Incision is clean and dry motor function is intact.  Disposition: Discharge disposition: 01-Home or Self Care       Discharge Instructions    Call MD for:  redness, tenderness, or signs of infection (pain, swelling, redness, odor or green/yellow discharge around incision site)   Complete by: As directed    Call MD for:  severe uncontrolled pain   Complete by: As directed    Call MD for:  temperature >100.4   Complete by: As directed    Diet - low sodium heart healthy   Complete by: As directed    Discharge instructions   Complete by: As directed    Okay to shower. Do not apply salves or appointments to incision. No heavy lifting with the upper extremities greater than 15 pounds. May resume driving when not requiring pain medication and patient feels comfortable with doing so.   Incentive spirometry RT   Complete by: As directed    Increase activity slowly   Complete by: As directed      Allergies as of 04/20/2019      Reactions   Moxifloxacin Hives, Itching, Rash   Avelox [moxifloxacin Hcl In Nacl] Itching, Rash   Other Rash   Neck brace padding    Prednisone Anxiety, Other (See Comments)   Hyperactivity, also      Medication List    TAKE these medications   ALPRAZolam 1 MG tablet Commonly known as: XANAX Take 1 mg by mouth 3 (three) times daily as needed for anxiety.   amLODipine-valsartan 10-160 MG tablet Commonly known as: EXFORGE Take 0.5 tablets by mouth daily.   amoxicillin-clavulanate 875-125 MG tablet Commonly known as: Augmentin Take 1 tablet by mouth 2 (two) times daily. One po bid x 7 days   citalopram 20 MG tablet Commonly known as: CELEXA Take 30 mg by mouth daily.   diazepam 5 MG tablet Commonly known as: Valium Take 1 tablet (5 mg total) by mouth every 6 (six) hours as needed for muscle spasms.   HYDROcodone-acetaminophen 5-325 MG tablet Commonly known as: NORCO/VICODIN Take 1-2 tablets by mouth every 4 (four) hours as needed for moderate pain or severe pain. What changed:   how much to take  when to take this   hydrOXYzine 25 MG tablet Commonly known as: ATARAX/VISTARIL Take 25 mg by mouth at bedtime.   levothyroxine 25 MCG tablet Commonly known as: SYNTHROID Take 25 mcg by mouth daily before breakfast.   meloxicam 15 MG tablet Commonly known as: MOBIC Take 15 mg by mouth daily.   omeprazole 40 MG capsule Commonly known as: PRILOSEC Take 40 mg by mouth in the morning and at bedtime.   Polyethyl Glycol-Propyl  Glycol 0.4-0.3 % Soln Place 1 drop into both eyes as needed (for dryness).   traMADol 50 MG tablet Commonly known as: ULTRAM Take 50 mg by mouth 2 (two) times daily as needed (for pain).   triamcinolone cream 0.1 % Commonly known as: KENALOG Apply 1 application topically See admin instructions. Apply to affected area(s) 2-3 times a day as needed for itching   VITAMIN B-12 PO Take 1 tablet by mouth daily.   Vitamin D3 50 MCG (2000 UT) Tabs Take 2,000  Units by mouth 3 (three) times a week.      Follow-up Information    Kristeen Miss, MD Follow up.   Specialty: Neurosurgery Contact information: [address] [place] [address] [place] [address] [phone]           Signed: Earleen Newport 04/23/2019, 8:00 AM

## 2021-11-16 IMAGING — MR MR MRA NECK WO/W CM
6 of 7 series · 41 of 48 positions shown · IV contrast (Contrast agent)
Comparison: Brain MRI and Cervical spine MRI today reported
separately.

CLINICAL DATA: 68-year-old female with acute neck pain. Prior
cervical spine surgery. Possible vertebral artery dissection.

EXAM:
MRA HEAD WITHOUT CONTRAST
MRA NECK WITHOUT AND WITH CONTRAST
TECHNIQUE: Angiographic images of the Circle of Willis were obtained using MRA
technique without intravenous contrast. Angiographic images of the
neck were obtained using MRA technique without and with intravenous
contrast. Carotid stenosis measurements (when applicable) are
obtained utilizing NASCET criteria, using the distal internal
carotid diameter as the denominator.
CONTRAST:  6mL GADAVIST GADOBUTROL 1 MMOL/ML IV SOLN

[Series 6: tof_fl3d_tra_iso · axial · 0.6mm · 0.52mm/px · z∈[-198,-121]mm · 11 of 133 slices shown]
[im 1/133]
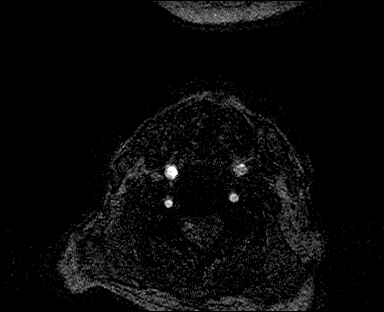
[im 14/133]
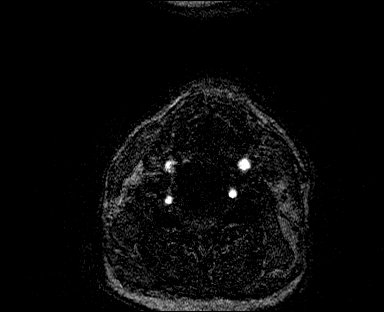
[im 27/133]
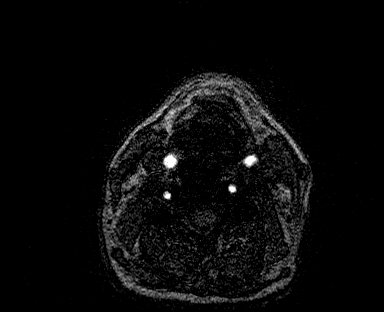
[im 40/133]
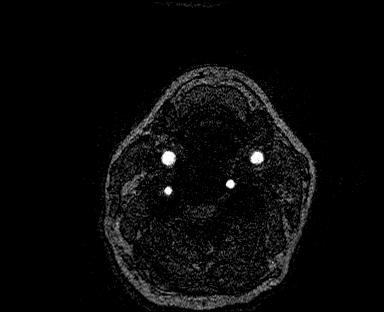
[im 53/133]
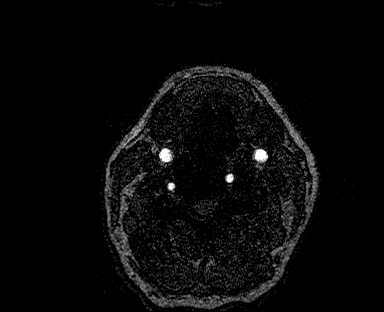
[im 67/133]
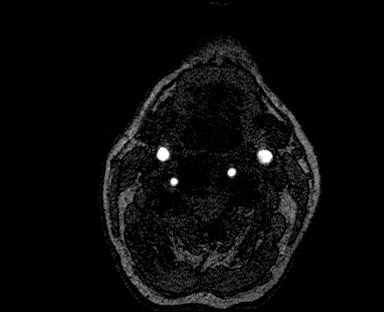
[im 80/133]
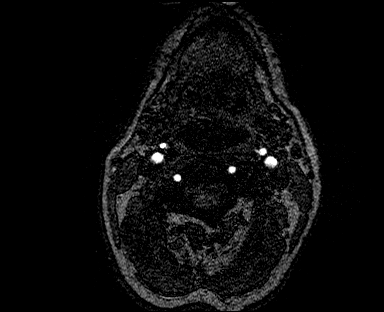
[im 93/133]
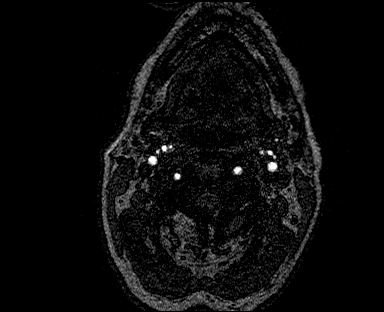
[im 106/133]
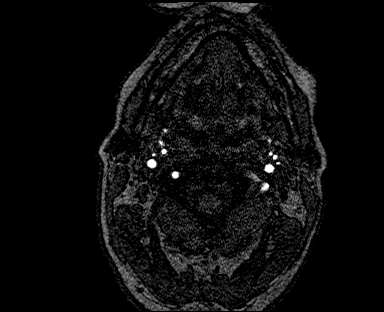
[im 119/133]
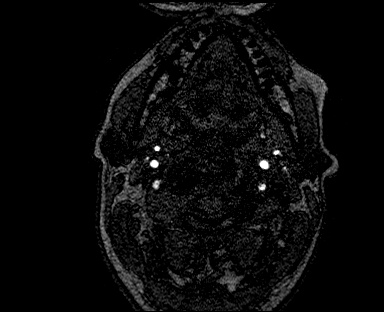
[im 133/133]
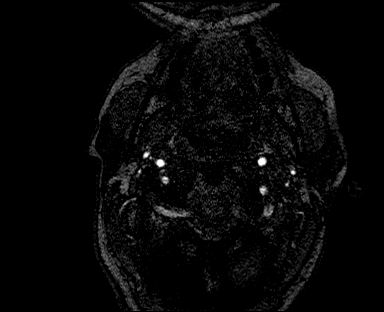

[Series 11: angio_fl3d_cor_post_ttc=3.0s · coronal · 0.9mm · 0.85mm/px · 7 of 88 slices shown (1 of 2)]
[im 1/88]
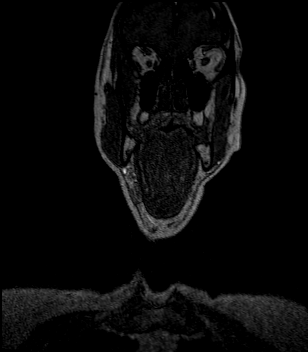
[im 15/88]
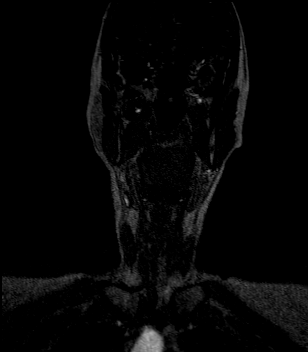
[im 30/88]
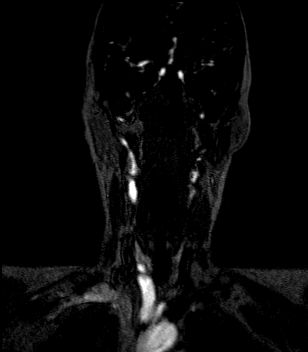
[im 44/88]
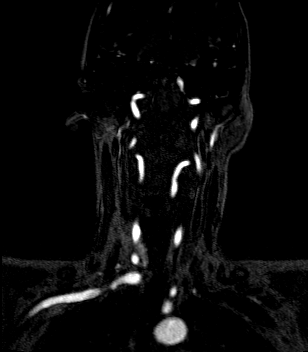
[im 59/88]
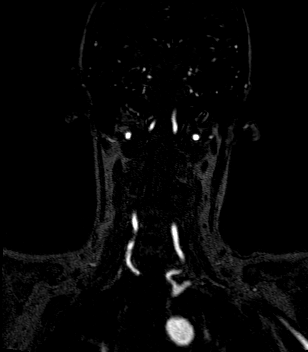
[im 73/88]
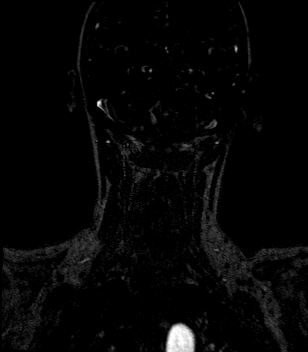
[im 88/88]
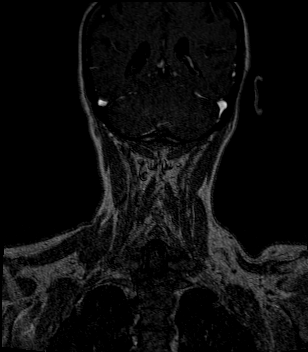

[Series 12: angio_fl3d_cor_post_ttc=3.0s_moco-adv · coronal · 0.9mm · 0.85mm/px · 6 of 88 slices shown (1 of 2)]
[im 1/88]
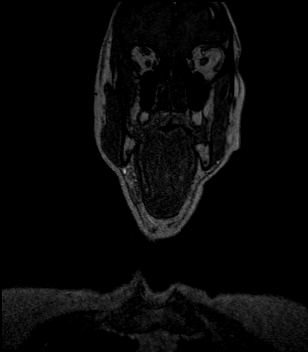
[im 18/88]
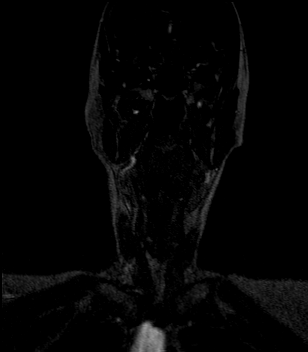
[im 35/88]
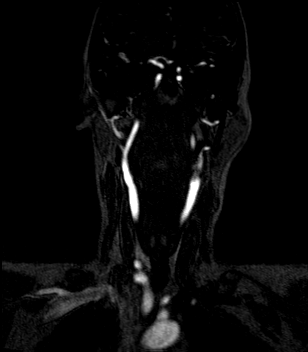
[im 53/88]
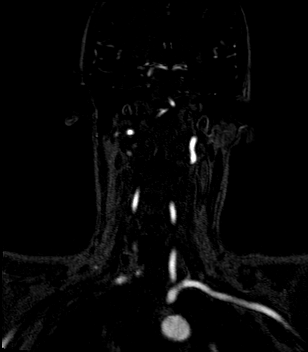
[im 70/88]
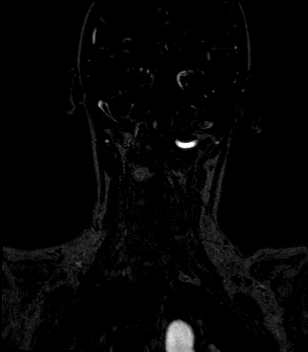
[im 88/88]
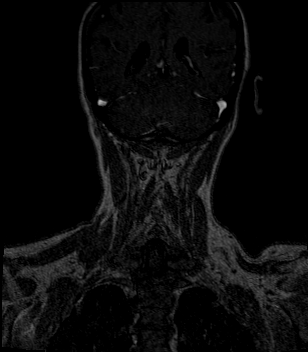

[Series 13: angio_fl3d_cor_post_ttc=3.0s_moco-adv_sub · coronal · 0.9mm · 0.85mm/px · 6 of 88 slices shown]
[im 1/88]
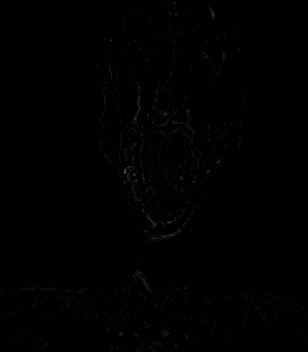
[im 18/88]
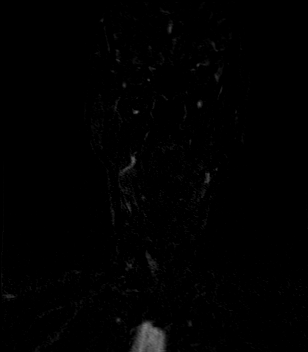
[im 35/88]
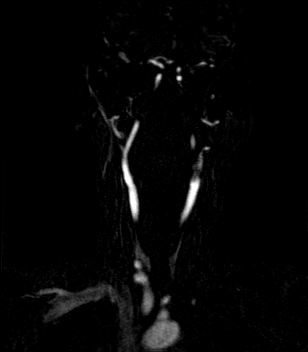
[im 53/88]
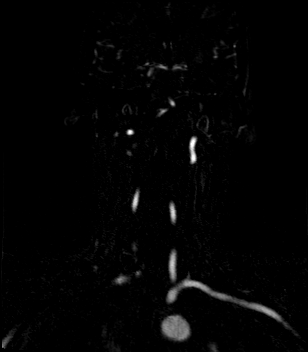
[im 70/88]
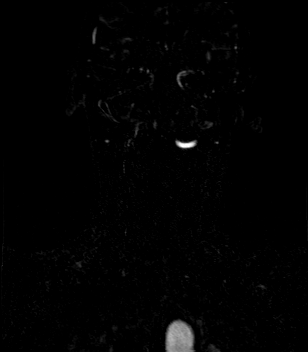
[im 88/88]
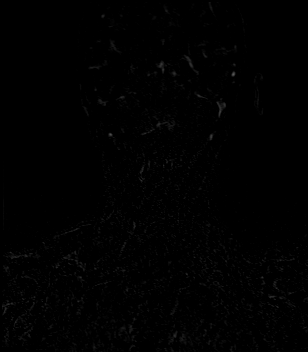

[Series 15: angio_fl3d_cor_post_ttc=3.0s · coronal · 0.9mm · 0.85mm/px · 6 of 88 slices shown (2 of 2)]
[im 1/88]
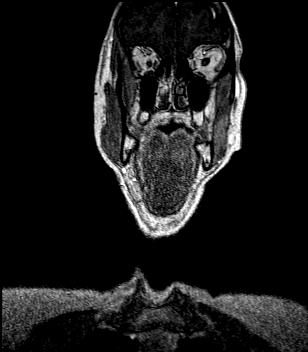
[im 18/88]
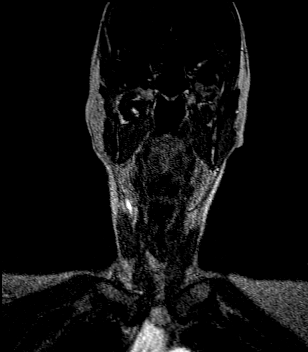
[im 35/88]
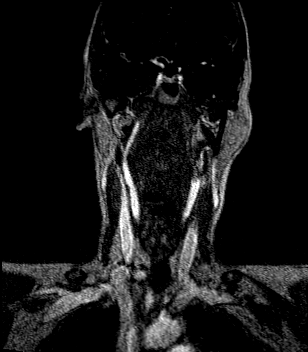
[im 53/88]
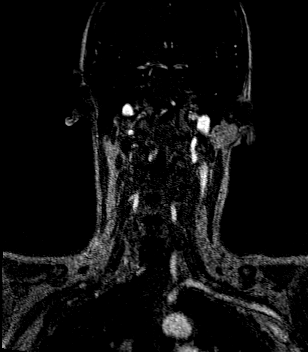
[im 70/88]
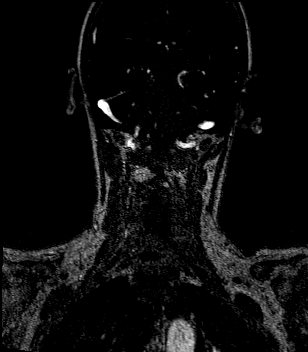
[im 88/88]
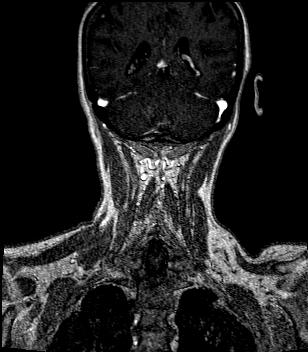

[Series 16: angio_fl3d_cor_post_ttc=3.0s_moco-adv · coronal · 0.9mm · 0.85mm/px · 5 of 88 slices shown (2 of 2)]
[im 1/88]
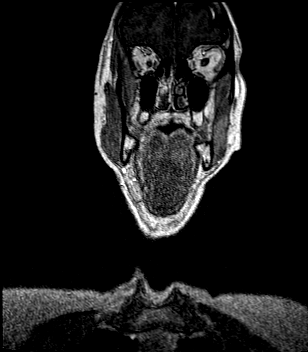
[im 18/88]
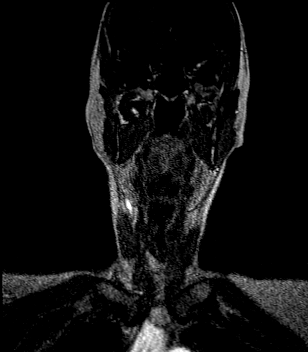
[im 35/88]
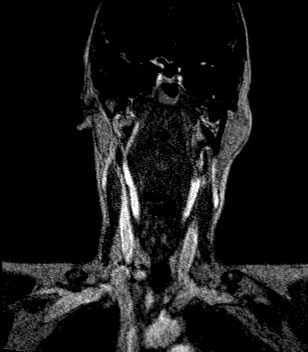
[im 53/88]
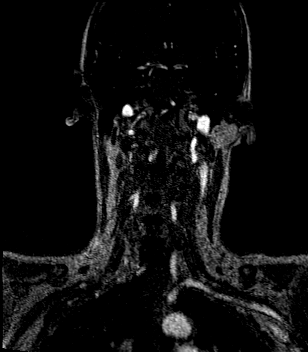
[im 70/88]
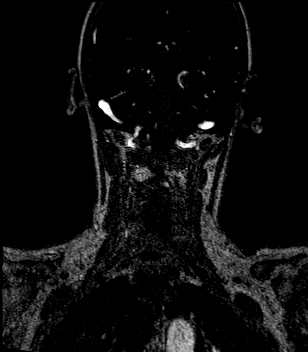

[41 of 48 positions shown; findings below may reference images not displayed]

FINDINGS: MRA NECK FINDINGS

Precontrast time-of-flight images demonstrate antegrade flow in both
cervical carotid and vertebral arteries to the skull base. The
vertebral arteries appear codominant. The carotid bifurcations are
within normal limits.

Post-contrast neck MRA images reveal a 3 vessel arch configuration.
Proximal great vessels appear normal.

Mildly tortuous proximal right CCA. At the right carotid bifurcation
mild irregularity primarily affects the proximal ECA. No cervical
right ICA stenosis. Mild irregularity of the visible right ICA
siphon.

Negative proximal left CCA. Negative left carotid bifurcation and
cervical left ICA. Negative visible left ICA siphon.

Proximal subclavian arteries and vertebral artery origins appear
normal. The bilateral cervical vertebral arteries appear symmetric
without irregularity or stenosis. Negative visible posterior
circulation.

MRA HEAD FINDINGS

Antegrade flow in the posterior circulation. Codominant distal
vertebral arteries without stenosis. Normal right PICA origin. The
left PICA appears to be diminutive or absent. Patent vertebrobasilar
junction and basilar artery without stenosis. Patent SCA and PCA
origins. Posterior communicating arteries are diminutive or absent.
Right PCA branches are within normal limits. There is a moderate
stenosis of the left PCA P1/P2 junction. Other left PCA branches are
within normal limits.

Antegrade flow in both ICA siphons. No siphon stenosis. Normal
ophthalmic artery origins. Patent carotid termini. Patent MCA and
ACA origins. Anterior communicating artery and visible ACA branches
are within normal limits. Left MCA M1 segment, left MCA bifurcation
and visible left MCA branches are within normal limits. Right MCA M1
segment, trifurcation, and visible right MCA branches are within
normal limits.
IMPRESSION: 1. Negative neck MRA. No evidence of vertebral artery dissection
across this series of head and neck MR today.
2. Evidence of some intracranial atherosclerosis, with a moderate
stenosis of the left PCA.
3. Otherwise negative intracranial MRA.

## 2023-03-10 ENCOUNTER — Other Ambulatory Visit (HOSPITAL_COMMUNITY): Payer: Self-pay | Admitting: Neurological Surgery

## 2023-03-10 ENCOUNTER — Ambulatory Visit (HOSPITAL_COMMUNITY)
Admission: RE | Admit: 2023-03-10 | Discharge: 2023-03-10 | Disposition: A | Discharge status: Home / Self Care | Payer: Medicare HMO | Source: Ambulatory Visit | Attending: Neurological Surgery | Admitting: Neurological Surgery

## 2023-03-10 DIAGNOSIS — M4802 Spinal stenosis, cervical region: Secondary | ICD-10-CM | POA: Insufficient documentation
